# Patient Record
Sex: Female | Born: 1990 | Race: White | Hispanic: No | Marital: Single | Smoking: Never smoker
Health system: Southern US, Community
[De-identification: ages and names within clinical notes are randomized; demographics above are authoritative.]

## PROBLEM LIST (undated history)

## (undated) DIAGNOSIS — F32A Depression, unspecified: Secondary | ICD-10-CM

## (undated) DIAGNOSIS — F419 Anxiety disorder, unspecified: Secondary | ICD-10-CM

## (undated) HISTORY — PX: TONSILLECTOMY: SUR1361

## (undated) HISTORY — PX: SCAR REVISION: SHX5285

## (undated) HISTORY — PX: CHOLECYSTECTOMY: SHX55

---

## 2019-04-25 ENCOUNTER — Other Ambulatory Visit: Payer: Self-pay

## 2019-04-25 DIAGNOSIS — Z20822 Contact with and (suspected) exposure to covid-19: Secondary | ICD-10-CM

## 2019-04-28 LAB — NOVEL CORONAVIRUS, NAA: SARS-CoV-2, NAA: NOT DETECTED

## 2020-12-31 LAB — OB RESULTS CONSOLE ABO/RH: RH Type: POSITIVE

## 2020-12-31 LAB — OB RESULTS CONSOLE GC/CHLAMYDIA
Chlamydia: NEGATIVE
Gonorrhea: NEGATIVE

## 2020-12-31 LAB — OB RESULTS CONSOLE ANTIBODY SCREEN: Antibody Screen: NEGATIVE

## 2020-12-31 LAB — OB RESULTS CONSOLE HEPATITIS B SURFACE ANTIGEN: Hepatitis B Surface Ag: NEGATIVE

## 2020-12-31 LAB — OB RESULTS CONSOLE RPR: RPR: NONREACTIVE

## 2020-12-31 LAB — OB RESULTS CONSOLE RUBELLA ANTIBODY, IGM: Rubella: IMMUNE

## 2020-12-31 LAB — OB RESULTS CONSOLE HIV ANTIBODY (ROUTINE TESTING): HIV: NONREACTIVE

## 2020-12-31 LAB — HEPATITIS C ANTIBODY: HCV Ab: NEGATIVE

## 2021-03-07 ENCOUNTER — Inpatient Hospital Stay (HOSPITAL_COMMUNITY)
Admission: AD | Admit: 2021-03-07 | Discharge: 2021-03-07 | Disposition: A | Discharge status: Home / Self Care | Payer: 59 | Attending: Obstetrics and Gynecology | Admitting: Obstetrics and Gynecology

## 2021-03-07 ENCOUNTER — Encounter (HOSPITAL_COMMUNITY): Payer: Self-pay | Admitting: Obstetrics and Gynecology

## 2021-03-07 ENCOUNTER — Other Ambulatory Visit: Payer: Self-pay

## 2021-03-07 DIAGNOSIS — R0981 Nasal congestion: Secondary | ICD-10-CM | POA: Diagnosis present

## 2021-03-07 DIAGNOSIS — Z3A18 18 weeks gestation of pregnancy: Secondary | ICD-10-CM | POA: Insufficient documentation

## 2021-03-07 DIAGNOSIS — U071 COVID-19: Secondary | ICD-10-CM | POA: Diagnosis not present

## 2021-03-07 DIAGNOSIS — O98512 Other viral diseases complicating pregnancy, second trimester: Secondary | ICD-10-CM | POA: Insufficient documentation

## 2021-03-07 NOTE — MAU Provider Note (Signed)
  History     CSN: [id number]  Arrival date and time: 03/07/21 1140   Event Date/Time   First Provider Initiated Contact with Patient 03/07/21 1201      Chief Complaint  Patient presents with   Nasal Congestion   HPI Tychelle A Juenger is a 30 y.o. G1P0 at 104w0d who presents from urgent care for evaluation. Has had a non productive cough & sinus congestion since Friday. Went to urgent care this morning & was diagnosed with COVID. Was told she needed to come her because her "blood pressure was low". She denies fever, body aches, shortness of breath, chest pain, abdominal pain, or vaginal bleeding.   OB History     Gravida  1   Para      Term      Preterm      AB      Living         SAB      IAB      Ectopic      Multiple      Live Births              No past medical history on file.  History reviewed. No pertinent surgical history.  No family history on file.     Allergies:  Allergies  Allergen Reactions   Hydrocodone Hives and Nausea Only    No medications prior to admission.    Review of Systems  Constitutional: Negative.   HENT:  Positive for congestion. Negative for sore throat.   Respiratory:  Positive for cough. Negative for chest tightness, shortness of breath and wheezing.   Cardiovascular:  Negative for chest pain and palpitations.  Gastrointestinal: Negative.   Genitourinary: Negative.   Musculoskeletal:  Negative for back pain.  Neurological:  Negative for dizziness and headaches.  Physical Exam   Blood pressure 112/63, pulse 67, temperature 98 F (36.7 C), resp. rate 18, SpO2 100 %.  Physical Exam Vitals and nursing note reviewed.  Constitutional:      Appearance: Normal appearance. She is not ill-appearing or toxic-appearing.  HENT:     Head: Normocephalic and atraumatic.  Eyes:     General: No scleral icterus. Pulmonary:     Effort: Pulmonary effort is normal. No respiratory distress.  Neurological:     General: No  focal deficit present.     Mental Status: She is alert.  Psychiatric:        Mood and Affect: Mood normal.        Behavior: Behavior normal.    MAU Course  Procedures  MDM FHT present via doppler Pt is normotensive & SpO2 is 100% She denies CP, SOB, or Ob complaints. Discussed symptomatic treatment & reasons to return to the hospital.   Assessment and Plan   1. COVID-19 affecting pregnancy in second trimester   2. [redacted] weeks gestation of pregnancy    -OTC med list given -discussed quarantine & given work note -reviewed reasons to return to MAU  Judeth Horn 03/07/2021, 2:20 PM

## 2021-03-07 NOTE — Discharge Instructions (Signed)

## 2021-03-07 NOTE — MAU Note (Signed)
Pt went to urgent care thought she had a sinus infection. Had some bloody mucus she was coughing up.  Sent from urgent care due to positive covid test and her b/p was low 90's/50's. Pt asymptomatic Denies any sob or dizzyness.

## 2021-06-05 NOTE — L&D Delivery Note (Signed)
Vaginal Delivery Note ? ?Patient pushed for less than 30 minutes after she was noted to be C/C/+2.  ?Guided pushing with maternal urge and regular contractions. ?At 4:14 PM a viable and healthy female was delivered via Vaginal, Spontaneous (Presentation:  OA  ).  APGAR: 8, 9; weight  pending.   ?After head was delivered shoulders and body easily delivered.  Baby held upside down and meconium suctioned out of mouth and nose well. Cord was wrapped around shoulder and body. Baby laid on maternal abdomen, dried and tactile stimulation.  ?Baby noted to have a vigorous cry and moving all four extremities.  ?Delayed cord clamping done and cord cut by father. Cord blood obtained.  ? ?Placenta spontaneously delivered intact with trailing membranes,   ?Uterine atony alleviated by IV pitocin and massage.  ?There were no lacerations noted upon inspection of the cervix, vagina and perineum.  ? ?Patient tolerated delivery well, there were no complications.  ? ? ?Delivery Details: ?Delivery Type: NSVD  ?Anesthesia Epidural  ?Episiotomy:  N/A  ?Lacerations:  None  ?Repair suture:  N/A  ?Blood loss (ml):  300   ? ?Birth information: ?Date of birth:   [birth year]  ?Time of birth:    16:14  ?Sex: Information for the patient's newborn:  Inessa, Wardrop [[id number]]  ?female    ?Name: "Dennard Nip"  ?APGAR APGAR (1 MIN): 8   ?APGAR (5 MINS): 9   ?APGAR (10 MINS):    ?Weight  Pending  ? ?Resuscitation:   Suction, drying, tactile stimulation  ?Cord information:    Blood gases sent?  N ?Complications:     None  ?Placenta: Delivered:   Spontaneous, intact    appearance: Normal 3VC ? ? ?Disposition: ?Mom to postpartum.  Baby to Couplet care / Skin to Skin. ? ?Essie Hart MD ?08/06/2021, 4:44 PM ? ?   ? ?  ?

## 2021-06-23 ENCOUNTER — Encounter (HOSPITAL_COMMUNITY): Payer: Self-pay | Admitting: Obstetrics & Gynecology

## 2021-06-23 ENCOUNTER — Inpatient Hospital Stay (HOSPITAL_COMMUNITY)
Admission: AD | Admit: 2021-06-23 | Discharge: 2021-06-23 | Disposition: A | Discharge status: Home / Self Care | Payer: 59 | Attending: Obstetrics & Gynecology | Admitting: Obstetrics & Gynecology

## 2021-06-23 ENCOUNTER — Other Ambulatory Visit: Payer: Self-pay

## 2021-06-23 DIAGNOSIS — O212 Late vomiting of pregnancy: Secondary | ICD-10-CM | POA: Diagnosis present

## 2021-06-23 DIAGNOSIS — Z20822 Contact with and (suspected) exposure to covid-19: Secondary | ICD-10-CM | POA: Insufficient documentation

## 2021-06-23 DIAGNOSIS — B349 Viral infection, unspecified: Secondary | ICD-10-CM

## 2021-06-23 DIAGNOSIS — Z3689 Encounter for other specified antenatal screening: Secondary | ICD-10-CM | POA: Diagnosis not present

## 2021-06-23 DIAGNOSIS — O98513 Other viral diseases complicating pregnancy, third trimester: Secondary | ICD-10-CM | POA: Insufficient documentation

## 2021-06-23 DIAGNOSIS — O219 Vomiting of pregnancy, unspecified: Secondary | ICD-10-CM

## 2021-06-23 DIAGNOSIS — O99283 Endocrine, nutritional and metabolic diseases complicating pregnancy, third trimester: Secondary | ICD-10-CM | POA: Diagnosis not present

## 2021-06-23 DIAGNOSIS — E860 Dehydration: Secondary | ICD-10-CM | POA: Insufficient documentation

## 2021-06-23 DIAGNOSIS — Z3A33 33 weeks gestation of pregnancy: Secondary | ICD-10-CM | POA: Diagnosis not present

## 2021-06-23 HISTORY — DX: Depression, unspecified: F32.A

## 2021-06-23 HISTORY — DX: Anxiety disorder, unspecified: F41.9

## 2021-06-23 LAB — RESP PANEL BY RT-PCR (FLU A&B, COVID) ARPGX2
Influenza A by PCR: NEGATIVE
Influenza B by PCR: NEGATIVE
SARS Coronavirus 2 by RT PCR: NEGATIVE

## 2021-06-23 LAB — COMPREHENSIVE METABOLIC PANEL
ALT: 9 U/L (ref 0–44)
AST: 18 U/L (ref 15–41)
Albumin: 2.9 g/dL — ABNORMAL LOW (ref 3.5–5.0)
Alkaline Phosphatase: 142 U/L — ABNORMAL HIGH (ref 38–126)
Anion gap: 12 (ref 5–15)
BUN: 5 mg/dL — ABNORMAL LOW (ref 6–20)
CO2: 19 mmol/L — ABNORMAL LOW (ref 22–32)
Calcium: 8.9 mg/dL (ref 8.9–10.3)
Chloride: 106 mmol/L (ref 98–111)
Creatinine, Ser: 0.62 mg/dL (ref 0.44–1.00)
GFR, Estimated: >60 mL/min (ref >60)
Glucose, Bld: 87 mg/dL (ref 70–99)
Potassium: 3.7 mmol/L (ref 3.5–5.1)
Sodium: 137 mmol/L (ref 135–145)
Total Bilirubin: 1.1 mg/dL (ref 0.3–1.2)
Total Protein: 6 g/dL — ABNORMAL LOW (ref 6.5–8.1)

## 2021-06-23 LAB — URINALYSIS, MICROSCOPIC (REFLEX)

## 2021-06-23 LAB — URINALYSIS, ROUTINE W REFLEX MICROSCOPIC
Glucose, UA: 100 mg/dL — AB
Hgb urine dipstick: NEGATIVE
Ketones, ur: >80 mg/dL — AB
Nitrite: NEGATIVE
Protein, ur: 30 mg/dL — AB
Specific Gravity, Urine: >1.03 — ABNORMAL HIGH (ref 1.005–1.030)
pH: 6 (ref 5.0–8.0)

## 2021-06-23 MED ORDER — SCOPOLAMINE 1 MG/3DAYS TD PT72: 1.0000 | MEDICATED_PATCH | Freq: Once | TRANSDERMAL | 12 refills | Status: AC

## 2021-06-23 MED ORDER — ONDANSETRON 4 MG PO TBDP
8.0000 mg | ORAL_TABLET | Freq: Once | ORAL | Status: DC
Start: 2021-06-23 — End: 2021-06-23

## 2021-06-23 MED ORDER — SCOPOLAMINE 1 MG/3DAYS TD PT72
1.0000 | MEDICATED_PATCH | Freq: Once | TRANSDERMAL | Status: DC
  Administered 2021-06-23: 1.5 mg via TRANSDERMAL
  Filled 2021-06-23: qty 1

## 2021-06-23 MED ORDER — ONDANSETRON 4 MG PO TBDP: 4.0000 mg | ORAL_TABLET | Freq: Three times a day (TID) | ORAL | 2 refills | Status: DC | PRN

## 2021-06-23 MED ORDER — SODIUM CHLORIDE 0.9 % IV SOLN
25.0000 mg | Freq: Once | INTRAVENOUS | Status: AC
  Administered 2021-06-23: 25 mg via INTRAVENOUS
  Filled 2021-06-23: qty 1

## 2021-06-23 MED ORDER — PROMETHAZINE HCL 25 MG PO TABS: 25.0000 mg | ORAL_TABLET | Freq: Four times a day (QID) | ORAL | 2 refills | Status: DC | PRN

## 2021-06-23 MED ORDER — SODIUM CHLORIDE 0.9 % IV SOLN
8.0000 mg | Freq: Once | INTRAVENOUS | Status: AC
  Administered 2021-06-23: 8 mg via INTRAVENOUS
  Filled 2021-06-23: qty 4

## 2021-06-23 NOTE — MAU Note (Signed)
Emma Cruz is a 31 y.o. at [redacted]w[redacted]d here in MAU reporting: nausea and vomiting for the past week. States it has been getting worse. 3 episodes of emesis in the past 24 hours. Was Rx zofran yesterday, took a dose around 0300 today and states it has not helped. No vomiting since 0200. No bleeding or LOF. Having some mild lower abdominal cramping.  Onset of complaint: ongoing  Pain score: 3/10  Vitals:   06/23/21 0957  BP: 117/73  Pulse: 79  Resp: 18  Temp: 98 F (36.7 C)  SpO2: 97%     FHT: EFM applied in room  Lab orders placed from triage: UA

## 2021-06-23 NOTE — MAU Provider Note (Signed)
History     [id number]  Arrival date and time: 06/23/21 0930    Chief Complaint  Patient presents with   Abdominal Pain   Nausea     HPI Emma Cruz is a 31 y.o. at 7036w3d, who presents for dehydration.   Review of outside prenatal records [facility] (in media tab): unfortunately no records available for review at [redacted] weeks gestation   Patient reports she has had nausea and vomiting for the past week and has been unable to keep down solids or liquids She went to her OB office yesterday and had blood/urine work done, she was instructed to try and PO hydrate overnight and present to MAU if not feeling better in the morning She was given zofran but feels that it did not help her very much Denies any other associated symptoms such as runny nose/congestion, cough, chest pain, shortness of breath, fever, burning or pain with urination, vaginal discharge No sick contacts that she knows of Has not been tested for covid or flu No vaginal bleeding or leaking fluid Some cramping but no contractions Fetal movement is normal      OB History     Gravida  2   Para  1   Term  1   Preterm      AB      Living  1      SAB      IAB      Ectopic      Multiple      Live Births  1           Past Medical History:  Diagnosis Date   Anxiety    Depression     Past Surgical History:  Procedure Laterality Date   CHOLECYSTECTOMY     SCAR REVISION     on left jaw   TONSILLECTOMY      Family History  Problem Relation Age of Onset   Diabetes Mother    Cancer Father        thyroid    Social History   Socioeconomic History   Marital status: Single    Spouse name: Not on file   Number of children: Not on file   Years of education: Not on file   Highest education level: Not on file  Occupational History   Not on file  Tobacco Use   Smoking status: Never   Smokeless tobacco: Never  Substance and Sexual Activity   Alcohol use: Not Currently   Drug  use: Never   Sexual activity: Not on file  Other Topics Concern   Not on file  Social History Narrative   Not on file   Social Determinants of Health   Financial Resource Strain: Not on file  Food Insecurity: Not on file  Transportation Needs: Not on file  Physical Activity: Not on file  Stress: Not on file  Social Connections: Not on file  Intimate Partner Violence: Not on file    Allergies  Allergen Reactions   Hydrocodone Hives and Nausea Only   Latex Rash    No current facility-administered medications on file prior to encounter.   Current Outpatient Medications on File Prior to Encounter  Medication Sig Dispense Refill   clonazePAM (KLONOPIN) 0.5 MG tablet Take 0.5 mg by mouth 3 (three) times daily as needed.     FLUoxetine (PROZAC) 20 MG capsule Take 20 mg by mouth daily.       ROS Pertinent positives and negative per HPI,  all others reviewed and negative  Physical Exam   BP 117/73 (BP Location: Right Arm)    Pulse 79    Temp 98 F (36.7 C) (Oral)    Resp 18    Ht 5\' 5"  (1.651 m)    Wt 78.1 kg    SpO2 97%    BMI 28.66 kg/m   Patient Vitals for the past 24 hrs:  BP Temp Temp src Pulse Resp SpO2 Height Weight  06/23/21 0957 117/73 98 F (36.7 C) Oral 79 18 97 % 5\' 5"  (1.651 m) 78.1 kg    Physical Exam Vitals reviewed.  Constitutional:      General: She is not in acute distress.    Appearance: She is well-developed. She is not diaphoretic.  Eyes:     General: No scleral icterus. Pulmonary:     Effort: Pulmonary effort is normal. No respiratory distress.  Skin:    General: Skin is warm and dry.  Neurological:     Mental Status: She is alert.     Coordination: Coordination normal.     Cervical Exam    Bedside Ultrasound Not done  My interpretation: n/a  FHT Baseline 135, moderate variability, +accels, no decels Toco: irritability Cat: I Reviewed at: 10:21 AM  Reviewed again at 12:25 PM, rare contractions but otherwise unchanged, still Cat  I   Labs Results for orders placed or performed during the hospital encounter of 06/23/21 (from the past 24 hour(s))  Urinalysis, Routine w reflex microscopic Urine, Clean Catch     Status: Abnormal   Collection Time: 06/23/21  9:46 AM  Result Value Ref Range   Color, Urine YELLOW YELLOW   APPearance HAZY (A) CLEAR   Specific Gravity, Urine >1.030 (H) 1.005 - 1.030   pH 6.0 5.0 - 8.0   Glucose, UA 100 (A) NEGATIVE mg/dL   Hgb urine dipstick NEGATIVE NEGATIVE   Bilirubin Urine SMALL (A) NEGATIVE   Ketones, ur >80 (A) NEGATIVE mg/dL   Protein, ur 30 (A) NEGATIVE mg/dL   Nitrite NEGATIVE NEGATIVE   Leukocytes,Ua TRACE (A) NEGATIVE  Urinalysis, Microscopic (reflex)     Status: Abnormal   Collection Time: 06/23/21  9:46 AM  Result Value Ref Range   RBC / HPF 0-5 0 - 5 RBC/hpf   WBC, UA 21-50 0 - 5 WBC/hpf   Bacteria, UA RARE (A) NONE SEEN   Squamous Epithelial / LPF 6-10 0 - 5   Mucus PRESENT    Hyaline Casts, UA PRESENT   Comprehensive metabolic panel     Status: Abnormal   Collection Time: 06/23/21 10:33 AM  Result Value Ref Range   Sodium 137 135 - 145 mmol/L   Potassium 3.7 3.5 - 5.1 mmol/L   Chloride 106 98 - 111 mmol/L   CO2 19 (L) 22 - 32 mmol/L   Glucose, Bld 87 70 - 99 mg/dL   BUN 5 (L) 6 - 20 mg/dL   Creatinine, Ser 06/25/21 0.44 - 1.00 mg/dL   Calcium 8.9 8.9 - 06/25/21 mg/dL   Total Protein 6.0 (L) 6.5 - 8.1 g/dL   Albumin 2.9 (L) 3.5 - 5.0 g/dL   AST 18 15 - 41 U/L   ALT 9 0 - 44 U/L   Alkaline Phosphatase 142 (H) 38 - 126 U/L   Total Bilirubin 1.1 0.3 - 1.2 mg/dL   GFR, Estimated 0.86 57.8 mL/min   Anion gap 12 5 - 15  Resp Panel by RT-PCR (Flu A&B, Covid) Nasopharyngeal Swab  Status: None   Collection Time: 06/23/21 10:33 AM   Specimen: Nasopharyngeal Swab; Nasopharyngeal(NP) swabs in vial transport medium  Result Value Ref Range   SARS Coronavirus 2 by RT PCR NEGATIVE NEGATIVE   Influenza A by PCR NEGATIVE NEGATIVE   Influenza B by PCR NEGATIVE NEGATIVE     Imaging No results found.  MAU Course  Procedures Lab Orders         Resp Panel by RT-PCR (Flu A&B, Covid) Nasopharyngeal Swab         Urinalysis, Routine w reflex microscopic Urine, Clean Catch         Comprehensive metabolic panel         Urinalysis, Microscopic (reflex)    Meds ordered this encounter  Medications   DISCONTD: ondansetron (ZOFRAN-ODT) disintegrating tablet 8 mg   ondansetron (ZOFRAN) 8 mg in sodium chloride 0.9 % 50 mL IVPB   promethazine (PHENERGAN) 25 mg in sodium chloride 0.9 % 1,000 mL infusion   scopolamine (TRANSDERM-SCOP) 1 MG/3DAYS 1.5 mg   scopolamine (TRANSDERM-SCOP) 1 MG/3DAYS    Sig: Place 1 patch (1.5 mg total) onto the skin once for 1 dose.    Dispense:  10 patch    Refill:  12   ondansetron (ZOFRAN-ODT) 4 MG disintegrating tablet    Sig: Take 1 tablet (4 mg total) by mouth every 8 (eight) hours as needed for nausea or vomiting.    Dispense:  30 tablet    Refill:  2   promethazine (PHENERGAN) 25 MG tablet    Sig: Take 1 tablet (25 mg total) by mouth every 6 (six) hours as needed for nausea or vomiting.    Dispense:  30 tablet    Refill:  2   Imaging Orders  No imaging studies ordered today    MDM moderate  Assessment and Plan  #Nausea and vomiting in pregnancy, third trimester #[redacted] weeks gestation of pregnancy #Viral illness Likely viral GI illness, COVID and flu swab negative. CMP unremarkable. Improved symptoms after fluids, zofran, phenergan, scopolamine patch. Low suspicion for PTL. Will d/c with antiemetics, cont to PO hydrate, return to MAU if unable to do so.   #FWB FHT Cat I NST: Reactive   Dispo: discharged to home in stable condition.   Venora Maples, MD/MPH 06/23/21 12:29 PM  Allergies as of 06/23/2021       Reactions   Hydrocodone Hives, Nausea Only   Latex Rash        Medication List     TAKE these medications    clonazePAM 0.5 MG tablet Commonly known as: KLONOPIN Take 0.5 mg by mouth 3 (three)  times daily as needed.   FLUoxetine 20 MG capsule Commonly known as: PROZAC Take 20 mg by mouth daily.   ondansetron 4 MG disintegrating tablet Commonly known as: ZOFRAN-ODT Take 1 tablet (4 mg total) by mouth every 8 (eight) hours as needed for nausea or vomiting.   promethazine 25 MG tablet Commonly known as: PHENERGAN Take 1 tablet (25 mg total) by mouth every 6 (six) hours as needed for nausea or vomiting.   scopolamine 1 MG/3DAYS Commonly known as: TRANSDERM-SCOP Place 1 patch (1.5 mg total) onto the skin once for 1 dose.

## 2021-07-13 LAB — OB RESULTS CONSOLE GBS: GBS: NEGATIVE

## 2021-07-22 ENCOUNTER — Inpatient Hospital Stay (HOSPITAL_COMMUNITY)
Admission: AD | Admit: 2021-07-22 | Discharge: 2021-07-22 | Disposition: A | Discharge status: Home / Self Care | Payer: 59 | Attending: Obstetrics and Gynecology | Admitting: Obstetrics and Gynecology

## 2021-07-22 ENCOUNTER — Other Ambulatory Visit: Payer: Self-pay

## 2021-07-22 ENCOUNTER — Encounter (HOSPITAL_COMMUNITY): Payer: Self-pay | Admitting: Obstetrics and Gynecology

## 2021-07-22 DIAGNOSIS — Z3A37 37 weeks gestation of pregnancy: Secondary | ICD-10-CM

## 2021-07-22 DIAGNOSIS — O471 False labor at or after 37 completed weeks of gestation: Secondary | ICD-10-CM | POA: Insufficient documentation

## 2021-07-22 DIAGNOSIS — O479 False labor, unspecified: Secondary | ICD-10-CM

## 2021-07-22 NOTE — MAU Provider Note (Signed)
None      S: Ms. Isabell Bonafede Lem is a 31 y.o. G2P1001 at [redacted]w[redacted]d  who presents to MAU today complaining contractions q 3 minutes  She denies vaginal bleeding. She denies LOF. She reports normal fetal movement.    O: BP 119/78    Pulse 75    Temp 97.6 F (36.4 C)    Resp 18    Ht 5\' 5"  (1.651 m)    Wt 80.7 kg    SpO2 100%    BMI 29.62 kg/m  GENERAL: Well-developed, well-nourished female in no acute distress.  HEAD: Normocephalic, atraumatic.  CHEST: Normal effort of breathing, regular heart rate ABDOMEN: Soft, nontender, gravid  Cervical exam:  Dilation: 1.5 Effacement (%): 80 Station: -1 Presentation: Vertex Exam by:: BRIA, RN   Fetal Monitoring: Baseline: 135 Variability: average Accelerations: present Decelerations: absent Contractions: irregular   A: SIUP at [redacted]w[redacted]d  Not in labor  P: Discharge home Labor precautions Encouraged to return if she develops worsening of symptoms, increase in pain, fever, or other concerning symptoms.     [redacted]w[redacted]d, CNM 07/22/2021 3:27 AM

## 2021-07-22 NOTE — MAU Note (Signed)
Ctxs all day yesterday but more consistent since 1700. Denies LOF or VB. 1cm on Tues. Baby has been active but not as active in the last 24hrs as usual

## 2021-07-24 ENCOUNTER — Other Ambulatory Visit: Payer: Self-pay

## 2021-07-24 ENCOUNTER — Encounter (HOSPITAL_COMMUNITY): Payer: Self-pay | Admitting: Obstetrics and Gynecology

## 2021-07-24 ENCOUNTER — Inpatient Hospital Stay (HOSPITAL_COMMUNITY)
Admission: AD | Admit: 2021-07-24 | Discharge: 2021-07-24 | Disposition: A | Discharge status: Home / Self Care | Payer: 59 | Attending: Obstetrics and Gynecology | Admitting: Obstetrics and Gynecology

## 2021-07-24 DIAGNOSIS — Z3A37 37 weeks gestation of pregnancy: Secondary | ICD-10-CM

## 2021-07-24 DIAGNOSIS — O479 False labor, unspecified: Secondary | ICD-10-CM | POA: Diagnosis not present

## 2021-07-24 DIAGNOSIS — Z0371 Encounter for suspected problem with amniotic cavity and membrane ruled out: Secondary | ICD-10-CM

## 2021-07-24 DIAGNOSIS — O471 False labor at or after 37 completed weeks of gestation: Secondary | ICD-10-CM | POA: Diagnosis not present

## 2021-07-24 DIAGNOSIS — O26893 Other specified pregnancy related conditions, third trimester: Secondary | ICD-10-CM | POA: Diagnosis present

## 2021-07-24 LAB — POCT FERN TEST: POCT Fern Test: NEGATIVE

## 2021-07-24 NOTE — MAU Provider Note (Signed)
History     [id number]  Arrival date and time: 07/24/21 2056    Chief Complaint  Patient presents with   Labor Eval   Rupture of Membranes     HPI Emma Cruz is a 31 y.o. at [redacted]w[redacted]d  who presents for contractions & vaginal discharge.  Reports episode of watery discharge earlier today that was clear with no odor.  No gush of fluid.  Leaking has not continued.  No vaginal bleeding.  Also reports contractions throughout the day that intensified at 7 PM cannot tell how frequent.  Rates pain 6/10.  Cervix was 1.5 cm dilated last week.  Vaginal bleeding: No LOF: Yes Fetal Movement: Yes Contractions: Yes     OB History     Gravida  2   Para  1   Term  1   Preterm      AB      Living  1      SAB      IAB      Ectopic      Multiple      Live Births  1           Past Medical History:  Diagnosis Date   Anxiety    Depression     Past Surgical History:  Procedure Laterality Date   CHOLECYSTECTOMY     SCAR REVISION     on left jaw   TONSILLECTOMY      Family History  Problem Relation Age of Onset   Diabetes Mother    Cancer Father        thyroid    Social History   Socioeconomic History   Marital status: Single    Spouse name: Not on file   Number of children: Not on file   Years of education: Not on file   Highest education level: Not on file  Occupational History   Not on file  Tobacco Use   Smoking status: Never   Smokeless tobacco: Never  Substance and Sexual Activity   Alcohol use: Not Currently   Drug use: Never   Sexual activity: Yes  Other Topics Concern   Not on file  Social History Narrative   Not on file   Social Determinants of Health   Financial Resource Strain: Not on file  Food Insecurity: Not on file  Transportation Needs: Not on file  Physical Activity: Not on file  Stress: Not on file  Social Connections: Not on file  Intimate Partner Violence: Not on file    Allergies  Allergen Reactions    Hydrocodone Hives and Nausea Only   Latex Rash    No current facility-administered medications on file prior to encounter.   Current Outpatient Medications on File Prior to Encounter  Medication Sig Dispense Refill   clonazePAM (KLONOPIN) 0.5 MG tablet Take 0.5 mg by mouth 3 (three) times daily as needed.     FLUoxetine (PROZAC) 20 MG capsule Take 20 mg by mouth daily.     ondansetron (ZOFRAN-ODT) 4 MG disintegrating tablet Take 1 tablet (4 mg total) by mouth every 8 (eight) hours as needed for nausea or vomiting. 30 tablet 2   promethazine (PHENERGAN) 25 MG tablet Take 1 tablet (25 mg total) by mouth every 6 (six) hours as needed for nausea or vomiting. 30 tablet 2     ROS Pertinent positives and negative per HPI, all others reviewed and negative  Physical Exam   BP 116/75    Pulse 61  Temp 97.7 F (36.5 C) (Oral)    Resp 18    Ht 5\' 5"  (1.651 m)    Wt 80 kg    SpO2 99%    BMI 29.34 kg/m   Patient Vitals for the past 24 hrs:  BP Temp Temp src Pulse Resp SpO2 Height Weight  07/24/21 2306 116/75 -- -- 61 -- -- -- --  07/24/21 2114 118/78 97.7 F (36.5 C) Oral 82 18 -- 5\' 5"  (1.651 m) 80 kg  07/24/21 2113 -- -- -- -- -- 99 % -- --    Physical Exam Vitals and nursing note reviewed. Exam conducted with a chaperone present.  Constitutional:      General: She is not in acute distress.    Appearance: Normal appearance.  Pulmonary:     Effort: Pulmonary effort is normal. No respiratory distress.  Genitourinary:    Comments: Pelvic: NEFG, small amount of thin white discharge. No pooling of fluid. No bleeding.  Skin:    General: Skin is warm and dry.  Neurological:     Mental Status: She is alert.  Psychiatric:        Mood and Affect: Mood normal.        Behavior: Behavior normal.     Cervical Exam Dilation: 1 Effacement (%): 50 Cervical Position: Middle Station: -2 Presentation: Vertex Exam by:: [id number] RN   FHT Baseline 120, moderate variability, 15x15  accels, no decels Toco: Q2-6 minutes Cat: 1  Labs Results for orders placed or performed during the hospital encounter of 07/24/21 (from the past 24 hour(s))  POCT fern test     Status: Normal   Collection Time: 07/24/21  9:49 PM  Result Value Ref Range   POCT Fern Test Negative = intact amniotic membranes     Imaging No results found.  MAU Course  Procedures Lab Orders         POCT fern test    No orders of the defined types were placed in this encounter.  Imaging Orders  No imaging studies ordered today    MDM Sterile speculum exam performed. No pooling of fluid. Fern slide collected & reviewed by me - negative.  Labor evaluation completed by RN. Cervix unchanged.   Review of outside records: [facility] prenatal records under media tab reviewed. GBS not listed.  Assessment and Plan   1. Encounter for suspected PROM, with rupture of membranes not found  -no pooling, fern negative  2. False labor  -cervix unchanged -discussed labor precautions & reasons to return  3. [redacted] weeks gestation of pregnancy      Discharge Instructions     Discharge patient   Complete by: As directed    Discharge disposition: 01-Home or Self Care   Discharge patient date: 07/24/2021       07/26/21, NP 07/24/21 11:16 PM

## 2021-07-24 NOTE — MAU Note (Signed)
..  Syniah A Senn is a 31 y.o. at [redacted]w[redacted]d here in MAU reporting: CTX all day, but more intense since 1900, and possible SROM around 2015. Pt reports clear odorless fluid, that has slow leak and continue. Pt is not wearing a pad.Pt reports having a HA, and has not taken any pain medication.  Pt denies VB, DFM, and complication in the pregnancy. No recent intercourse.  GBS neg SVE last Friday 1.5 cm  Onset of complaint: 1900 Pain score: 6/10 Vitals:   07/24/21 2113 07/24/21 2114  BP:  118/78  Pulse:  82  Resp:  18  Temp:  97.7 F (36.5 C)  SpO2: 99%      [id number] Lab orders placed from triage:  none

## 2021-08-03 ENCOUNTER — Other Ambulatory Visit: Payer: Self-pay | Admitting: Obstetrics & Gynecology

## 2021-08-03 ENCOUNTER — Telehealth (HOSPITAL_COMMUNITY): Payer: Self-pay | Admitting: *Deleted

## 2021-08-03 NOTE — Telephone Encounter (Signed)
Preadmission screen  

## 2021-08-06 ENCOUNTER — Inpatient Hospital Stay (HOSPITAL_COMMUNITY)
Admission: AD | Admit: 2021-08-06 | Discharge: 2021-08-08 | DRG: 807 | Disposition: A | Discharge status: Home / Self Care | Payer: 59 | Attending: Obstetrics & Gynecology | Admitting: Obstetrics & Gynecology

## 2021-08-06 ENCOUNTER — Inpatient Hospital Stay (HOSPITAL_COMMUNITY): Payer: 59 | Admitting: Anesthesiology

## 2021-08-06 ENCOUNTER — Encounter (HOSPITAL_COMMUNITY): Payer: Self-pay | Admitting: Obstetrics & Gynecology

## 2021-08-06 ENCOUNTER — Other Ambulatory Visit: Payer: Self-pay

## 2021-08-06 DIAGNOSIS — O26893 Other specified pregnancy related conditions, third trimester: Secondary | ICD-10-CM | POA: Diagnosis present

## 2021-08-06 DIAGNOSIS — O480 Post-term pregnancy: Secondary | ICD-10-CM | POA: Diagnosis present

## 2021-08-06 DIAGNOSIS — O99344 Other mental disorders complicating childbirth: Secondary | ICD-10-CM | POA: Diagnosis present

## 2021-08-06 DIAGNOSIS — Z3A39 39 weeks gestation of pregnancy: Secondary | ICD-10-CM | POA: Diagnosis not present

## 2021-08-06 DIAGNOSIS — Z20822 Contact with and (suspected) exposure to covid-19: Secondary | ICD-10-CM | POA: Diagnosis present

## 2021-08-06 DIAGNOSIS — F410 Panic disorder [episodic paroxysmal anxiety] without agoraphobia: Secondary | ICD-10-CM | POA: Diagnosis present

## 2021-08-06 LAB — CBC
HCT: 38.6 % (ref 36.0–46.0)
Hemoglobin: 12.8 g/dL (ref 12.0–15.0)
MCH: 28 pg (ref 26.0–34.0)
MCHC: 33.2 g/dL (ref 30.0–36.0)
MCV: 84.5 fL (ref 80.0–100.0)
Platelets: 187 10*3/uL (ref 150–400)
RBC: 4.57 MIL/uL (ref 3.87–5.11)
RDW: 13.4 % (ref 11.5–15.5)
WBC: 15 10*3/uL — ABNORMAL HIGH (ref 4.0–10.5)
nRBC: 0 % (ref 0.0–0.2)

## 2021-08-06 LAB — RESP PANEL BY RT-PCR (FLU A&B, COVID) ARPGX2
Influenza A by PCR: NEGATIVE
Influenza B by PCR: NEGATIVE
SARS Coronavirus 2 by RT PCR: NEGATIVE

## 2021-08-06 LAB — TYPE AND SCREEN
ABO/RH(D): O POS
Antibody Screen: NEGATIVE

## 2021-08-06 MED ORDER — LIDOCAINE HCL (PF) 1 % IJ SOLN: 30.0000 mL | INTRAMUSCULAR | Status: DC | PRN

## 2021-08-06 MED ORDER — TETANUS-DIPHTH-ACELL PERTUSSIS 5-2.5-18.5 LF-MCG/0.5 IM SUSY
0.5000 mL | PREFILLED_SYRINGE | Freq: Once | INTRAMUSCULAR | Status: DC
Start: 2021-08-07 — End: 2021-08-08

## 2021-08-06 MED ORDER — FENTANYL-BUPIVACAINE-NACL 0.5-0.125-0.9 MG/250ML-% EP SOLN
12.0000 mL/h | EPIDURAL | Status: DC | PRN
  Administered 2021-08-06: 12 mL/h via EPIDURAL

## 2021-08-06 MED ORDER — LACTATED RINGERS IV SOLN: 500.0000 mL | Freq: Once | INTRAVENOUS | Status: DC

## 2021-08-06 MED ORDER — EPHEDRINE 5 MG/ML INJ: 10.0000 mg | INTRAVENOUS | Status: DC | PRN

## 2021-08-06 MED ORDER — LACTATED RINGERS IV SOLN: INTRAVENOUS | Status: DC

## 2021-08-06 MED ORDER — OXYTOCIN BOLUS FROM INFUSION
333.0000 mL | Freq: Once | INTRAVENOUS | Status: AC
  Administered 2021-08-06: 333 mL via INTRAVENOUS

## 2021-08-06 MED ORDER — COCONUT OIL OIL: 1.0000 "application " | TOPICAL_OIL | Status: DC | PRN

## 2021-08-06 MED ORDER — DIBUCAINE (PERIANAL) 1 % EX OINT: 1.0000 "application " | TOPICAL_OINTMENT | CUTANEOUS | Status: DC | PRN

## 2021-08-06 MED ORDER — FLUOXETINE HCL 20 MG PO CAPS
20.0000 mg | ORAL_CAPSULE | Freq: Every day | ORAL | Status: DC
  Administered 2021-08-07 – 2021-08-08 (×2): 20 mg via ORAL
  Filled 2021-08-06 (×2): qty 1

## 2021-08-06 MED ORDER — ONDANSETRON HCL 4 MG/2ML IJ SOLN: 4.0000 mg | Freq: Four times a day (QID) | INTRAMUSCULAR | Status: DC | PRN

## 2021-08-06 MED ORDER — ONDANSETRON HCL 4 MG PO TABS: 4.0000 mg | ORAL_TABLET | ORAL | Status: DC | PRN

## 2021-08-06 MED ORDER — PRENATAL MULTIVITAMIN CH
1.0000 | ORAL_TABLET | Freq: Every day | ORAL | Status: DC
  Administered 2021-08-07 – 2021-08-08 (×2): 1 via ORAL
  Filled 2021-08-06 (×2): qty 1

## 2021-08-06 MED ORDER — SIMETHICONE 80 MG PO CHEW: 80.0000 mg | CHEWABLE_TABLET | ORAL | Status: DC | PRN

## 2021-08-06 MED ORDER — PHENYLEPHRINE 40 MCG/ML (10ML) SYRINGE FOR IV PUSH (FOR BLOOD PRESSURE SUPPORT): 80.0000 ug | PREFILLED_SYRINGE | INTRAVENOUS | Status: DC | PRN

## 2021-08-06 MED ORDER — SOD CITRATE-CITRIC ACID 500-334 MG/5ML PO SOLN: 30.0000 mL | ORAL | Status: DC | PRN

## 2021-08-06 MED ORDER — IBUPROFEN 600 MG PO TABS
600.0000 mg | ORAL_TABLET | Freq: Four times a day (QID) | ORAL | Status: DC
  Administered 2021-08-06 – 2021-08-08 (×7): 600 mg via ORAL
  Filled 2021-08-06 (×8): qty 1

## 2021-08-06 MED ORDER — CLONAZEPAM 1 MG PO TABS: 0.5000 mg | ORAL_TABLET | Freq: Three times a day (TID) | ORAL | Status: DC | PRN

## 2021-08-06 MED ORDER — SENNOSIDES-DOCUSATE SODIUM 8.6-50 MG PO TABS
2.0000 | ORAL_TABLET | Freq: Every day | ORAL | Status: DC
  Administered 2021-08-07 – 2021-08-08 (×2): 2 via ORAL
  Filled 2021-08-06 (×2): qty 2

## 2021-08-06 MED ORDER — OXYTOCIN-SODIUM CHLORIDE 30-0.9 UT/500ML-% IV SOLN
2.5000 [IU]/h | INTRAVENOUS | Status: DC
  Filled 2021-08-06: qty 500

## 2021-08-06 MED ORDER — DIPHENHYDRAMINE HCL 50 MG/ML IJ SOLN: 12.5000 mg | INTRAMUSCULAR | Status: DC | PRN

## 2021-08-06 MED ORDER — DIPHENHYDRAMINE HCL 25 MG PO CAPS: 25.0000 mg | ORAL_CAPSULE | Freq: Four times a day (QID) | ORAL | Status: DC | PRN

## 2021-08-06 MED ORDER — ZOLPIDEM TARTRATE 5 MG PO TABS: 5.0000 mg | ORAL_TABLET | Freq: Every evening | ORAL | Status: DC | PRN

## 2021-08-06 MED ORDER — ACETAMINOPHEN 325 MG PO TABS: 650.0000 mg | ORAL_TABLET | ORAL | Status: DC | PRN

## 2021-08-06 MED ORDER — ONDANSETRON HCL 4 MG/2ML IJ SOLN: 4.0000 mg | INTRAMUSCULAR | Status: DC | PRN

## 2021-08-06 MED ORDER — LACTATED RINGERS IV SOLN: 500.0000 mL | INTRAVENOUS | Status: DC | PRN

## 2021-08-06 MED ORDER — LIDOCAINE HCL (PF) 1 % IJ SOLN
INTRAMUSCULAR | Status: DC | PRN
  Administered 2021-08-06 (×2): 4 mL via EPIDURAL

## 2021-08-06 MED ORDER — WITCH HAZEL-GLYCERIN EX PADS: 1.0000 "application " | MEDICATED_PAD | CUTANEOUS | Status: DC | PRN

## 2021-08-06 MED ORDER — FLUOXETINE HCL 20 MG PO CAPS: 20.0000 mg | ORAL_CAPSULE | Freq: Every day | ORAL | Status: DC

## 2021-08-06 MED ORDER — BENZOCAINE-MENTHOL 20-0.5 % EX AERO
1.0000 "application " | INHALATION_SPRAY | CUTANEOUS | Status: DC | PRN
  Administered 2021-08-06: 1 via TOPICAL
  Filled 2021-08-06: qty 56

## 2021-08-06 MED ORDER — FENTANYL-BUPIVACAINE-NACL 0.5-0.125-0.9 MG/250ML-% EP SOLN
EPIDURAL | Status: AC
  Filled 2021-08-06: qty 250

## 2021-08-06 NOTE — Anesthesia Procedure Notes (Signed)
Epidural ?Patient location during procedure: OB ?Start time: 08/06/2021 3:14 PM ?End time: 08/06/2021 3:17 PM ? ?Staffing ?Anesthesiologist: Kaylyn Layer, MD ?Performed: anesthesiologist  ? ?Preanesthetic Checklist ?Completed: patient identified, IV checked, risks and benefits discussed, monitors and equipment checked, pre-op evaluation and timeout performed ? ?Epidural ?Patient position: sitting ?Prep: DuraPrep and site prepped and draped ?Patient monitoring: continuous pulse ox, blood pressure and heart rate ?Approach: midline ?Location: L3-L4 ?Injection technique: LOR air ? ?Needle:  ?Needle type: Tuohy  ?Needle gauge: 17 G ?Needle length: 9 cm ?Needle insertion depth: 5 cm ?Catheter type: closed end flexible ?Catheter size: 19 Gauge ?Catheter at skin depth: 10 cm ?Test dose: negative and Other (1% lidocaine) ? ?Assessment ?Events: blood not aspirated, injection not painful, no injection resistance, no paresthesia and negative IV test ? ?Additional Notes ?Patient identified. Risks, benefits, and alternatives discussed with patient including but not limited to bleeding, infection, nerve damage, paralysis, failed block, incomplete pain control, headache, blood pressure changes, nausea, vomiting, reactions to medication, itching, and postpartum back pain. Confirmed with bedside nurse the patient's most recent platelet count. Confirmed with patient that they are not currently taking any anticoagulation, have any bleeding history, or any family history of bleeding disorders. Patient expressed understanding and wished to proceed. All questions were answered. Sterile technique was used throughout the entire procedure. Please see nursing notes for vital signs.  ? ?Crisp LOR on first pass. Test dose was given through epidural catheter and negative prior to continuing to dose epidural or start infusion. Warning signs of high block given to the patient including shortness of breath, tingling/numbness in hands, complete  motor block, or any concerning symptoms with instructions to call for help. Patient was given instructions on fall risk and not to get out of bed. All questions and concerns addressed with instructions to call with any issues or inadequate analgesia.  Reason for block:procedure for pain ? ? ? ?

## 2021-08-06 NOTE — Anesthesia Preprocedure Evaluation (Signed)
Anesthesia Evaluation  Patient identified by MRN, date of birth, ID band Patient awake    Reviewed: Allergy & Precautions, Patient's Chart, lab work & pertinent test results  History of Anesthesia Complications Negative for: history of anesthetic complications  Airway Mallampati: II  TM Distance: >3 FB Neck ROM: Full    Dental no notable dental hx.    Pulmonary neg pulmonary ROS,    Pulmonary exam normal        Cardiovascular negative cardio ROS Normal cardiovascular exam     Neuro/Psych Anxiety Depression negative neurological ROS     GI/Hepatic negative GI ROS, Neg liver ROS,   Endo/Other  negative endocrine ROS  Renal/GU negative Renal ROS  negative genitourinary   Musculoskeletal negative musculoskeletal ROS (+)   Abdominal   Peds  Hematology negative hematology ROS (+)   Anesthesia Other Findings Day of surgery medications reviewed with patient.  Reproductive/Obstetrics (+) Pregnancy                             Anesthesia Physical Anesthesia Plan  ASA: 2  Anesthesia Plan: Epidural   Post-op Pain Management:    Induction:   PONV Risk Score and Plan: Treatment may vary due to age or medical condition  Airway Management Planned: Natural Airway  Additional Equipment: Fetal Monitoring  Intra-op Plan:   Post-operative Plan:   Informed Consent: I have reviewed the patients History and Physical, chart, labs and discussed the procedure including the risks, benefits and alternatives for the proposed anesthesia with the patient or authorized representative who has indicated his/her understanding and acceptance.       Plan Discussed with:   Anesthesia Plan Comments:         Anesthesia Quick Evaluation  

## 2021-08-06 NOTE — MAU Note (Signed)
...  Emma Cruz is a 31 y.o. at [redacted]w[redacted]d here in MAU reporting: CTX since 0900 this morning that are currently "back to back." She states they were initially 10 minutes apart. Endorses bloody show. No LOF. +FM.  ? ?Membranes stripped in the office this past Tuesday and was 2 cm. ? ?Pain score:  ?7/10 lower abdomen ? ?Vitals:  ? 08/06/21 1243  ?BP: 120/70  ?Pulse: 68  ?Resp: 17  ?Temp: 97.8 ?F (36.6 ?C)  ?SpO2: 100%  ?   ?FHT: 145 initial external ?Lab orders placed from triage:  MAU Labor ? ?

## 2021-08-06 NOTE — Lactation Note (Signed)
This note was copied from a baby's chart. ?Lactation Consultation Note ? ?Patient Name: Emma Cruz ?Today's Date: 08/06/2021 ?Reason for consult: Initial assessment;Term ?Age:31 years ? ?LC in to room for initial consult. Mother states infant is latched upon arrival. Mother denies pain or discomfort. Noted good deep latch, back/neck support, optimal alignment and good suck/swallowing. ?Reviewed normal newborn behavior during first 24h, expected output and feeding frequency.  ? ?Plan: ?1-Skin to skin, aim for a deep, comfortable latch and breastfeed on demand or 8-12 times in 24h period. ?2-Encouraged maternal rest, hydration and food intake.  ?3-Contact LC as needed for feeds/support/concerns/questions ?  ?All questions answered at this time. Provided Lactation services brochure and promoted [employer] booklet information.  ? ? ?Maternal Data ?Has patient been taught Hand Expression?: No ?Does the patient have breastfeeding experience prior to this delivery?: Yes ?How long did the patient breastfeed?: 8 months ? ?Feeding ?Mother's Current Feeding Choice: Breast Milk ? ?LATCH Score ?Latch: Grasps breast easily, tongue down, lips flanged, rhythmical sucking. ? ?Audible Swallowing: Spontaneous and intermittent ? ?Type of Nipple: Everted at rest and after stimulation ? ?Comfort (Breast/Nipple): Soft / non-tender ? ?Hold (Positioning): No assistance needed to correctly position infant at breast. ? ?LATCH Score: 10 ? ?Interventions ?Interventions: Breast feeding basics reviewed;Skin to skin;Expressed milk;Education;LC Services brochure ? ?Discharge ?Pump: Personal ? ?Consult Status ?Consult Status: Follow-up ?Date: 08/07/21 ?Follow-up type: In-patient ? ? ? ?Joslyne Marshburn A Higuera Ancidey ?08/06/2021, 6:57 PM ? ? ? ?

## 2021-08-06 NOTE — H&P (Signed)
OB ADMISSION HISTORY & PHYSICAL ? ?Admission Date: 08/06/2021 12:35 PM  ?Admit Diagnosis: Active labor ? ?Emma Cruz is a 31 y.o. female G69P1011 [redacted]w[redacted]d presenting for regular painful contractions. Endorses active FM, denies LOF and vaginal bleeding. Ctx began @ 0900 ? ?History of current pregnancy: ?G3P1011   ?Prenatal Care with: [facility] ?Patient entered prenatal care at 7 wks.   ?EDC 08/08/21 by LMP and congruent w/ 7 wk U/S.   ?Anatomy scan:  20 wks, complete w/ posterior placenta.   ? ?Significant prenatal problems: ?Family history of congenital cardiac anomalies ?Fetal Tricuspid regurgitation ?Vitamin D deficiency ?Panic disorder, anxiety ?Depression  ? ?Patient Active Problem List  ? Diagnosis Date Noted  ? Post-dates pregnancy 08/06/2021  ? ? ?Prenatal Labs: ?ABO, Rh: O/Positive/-- (07/29 0000) ?Antibody: Negative (07/29 0000) ?Rubella: Immune (07/29 0000)  ?RPR: Nonreactive (07/29 0000)  ?HBsAg: Negative (07/29 0000)  ?HIV: Non-reactive (07/29 0000)  ?GBS: Negative/-- (02/08 0000)  ?GC/CHL: Neg ?Genetics: Low risk female NIPS ?Vaccines: ?Tdap: declined ?Flu declined ?Covid: declined ? ?Prenatal Transfer Tool  ?Maternal Diabetes: No ?Genetic Screening: Normal ?Maternal Ultrasounds/Referrals: Other:Mild mitral valve regurg ?Fetal Ultrasounds or other Referrals:  Fetal echo ?Maternal Substance Abuse:  No ?Significant Maternal Medications:  Meds include: Prozac Other: clonazepam, cyclobenzparine ?Significant Maternal Lab Results:  Group B Strep negative ?Other Comments:  None ? ?OB History  ?Gravida Para Term Preterm AB Living  ?3 1 1   1 1   ?SAB IAB Ectopic Multiple Live Births  ?1       1  ?  ?# Outcome Date GA Lbr Len/2nd Weight Sex Delivery Anes PTL Lv  ?3 Current           ?2 Term 02/14/14 [redacted]w[redacted]d    Vag-Spont   LIV  ?1 SAB           ? ? ?Medical / Surgical History: ?Past medical history:  ?Past Medical History:  ?Diagnosis Date  ? Anxiety   ? Depression   ?  ?Past surgical history:  ?Past Surgical History:   ?Procedure Laterality Date  ? CHOLECYSTECTOMY    ? SCAR REVISION    ? on left jaw  ? TONSILLECTOMY    ? ?Family History:  ?Family History  ?Problem Relation Age of Onset  ? Diabetes Mother   ? Cancer Father   ?     thyroid  ?  ?Social History:  reports that she has never smoked. She has never used smokeless tobacco. She reports that she does not currently use alcohol. She reports that she does not use drugs. ? ?Allergies: ?Hydrocodone and Latex ?  ?Current Medications at time of admission:  ?Prior to Admission medications   ?Medication Sig Start Date End Date Taking? Authorizing Provider  ?clonazePAM (KLONOPIN) 0.5 MG tablet Take 0.5 mg by mouth 3 (three) times daily as needed. 01/19/21  Yes [provider]  ?FLUoxetine (PROZAC) 20 MG capsule Take 20 mg by mouth daily. 09/17/20  Yes [provider]  ?ondansetron (ZOFRAN-ODT) 4 MG disintegrating tablet Take 1 tablet (4 mg total) by mouth every 8 (eight) hours as needed for nausea or vomiting. 06/23/21   Clarnce Flock, MD  ?promethazine (PHENERGAN) 25 MG tablet Take 1 tablet (25 mg total) by mouth every 6 (six) hours as needed for nausea or vomiting. 06/23/21   Clarnce Flock, MD  ? ? ?Review of Systems: ?Constitutional: Negative   ?HENT: Negative   ?Eyes: Negative   ?Respiratory: Negative   ?Cardiovascular: Negative   ?  Gastrointestinal: Negative  ?Genitourinary: neg for bloody show, neg for LOF   ?Musculoskeletal: Negative   ?Skin: Negative   ?Neurological: Negative   ?Endo/Heme/Allergies: Negative   ?Psychiatric/Behavioral: Negative  ? ? ?Physical Exam: ?VS: Blood pressure 120/70, pulse 68, temperature 97.8 ?F (36.6 ?C), temperature source Oral, resp. rate 17, SpO2 100 %. ?AAO x3, no signs of distress ?Cardiovascular: RRR ?Respiratory: Lung fields clear to ausculation ?GU/GI: Abdomen gravid, non-tender, non-distended, active FM, vertex, EFW 3000gram per Leopold's ?Extremities: No edema, negative for pain, tenderness, and cords ? ?Cervical  exam:Dilation: 5.5 ?Effacement (%): 80 ?Station: -2 ?Exam by:: Wilhemena Durie RN ?FHR: baseline rate 120 / variability moderate / accelerations present / absent decelerations ?TOCO: q 2 min ctx ? ?  ? ? ?Assessment: ?31 y.o. [id number] [redacted]w[redacted]d admitted for active labor ? ?active stage of labor ?FHR category 1 ?GBS neg ?Pain management plan: labor support ? ? ?Plan:  ?Admit to Labor and Delivery ?Routine admission orders ?Epidural on maternal demand ?IV hydration ?Continuous monitoring ?Anticipate NSVD delivery ? ? ?Sanjuana Kava MD ?08/06/2021 1:59 PM  ?

## 2021-08-07 LAB — CBC
HCT: 29.9 % — ABNORMAL LOW (ref 36.0–46.0)
Hemoglobin: 10.2 g/dL — ABNORMAL LOW (ref 12.0–15.0)
MCH: 29.1 pg (ref 26.0–34.0)
MCHC: 34.1 g/dL (ref 30.0–36.0)
MCV: 85.4 fL (ref 80.0–100.0)
Platelets: 148 10*3/uL — ABNORMAL LOW (ref 150–400)
RBC: 3.5 MIL/uL — ABNORMAL LOW (ref 3.87–5.11)
RDW: 13.7 % (ref 11.5–15.5)
WBC: 12.8 10*3/uL — ABNORMAL HIGH (ref 4.0–10.5)
nRBC: 0 % (ref 0.0–0.2)

## 2021-08-07 LAB — RPR: RPR Ser Ql: NONREACTIVE

## 2021-08-07 NOTE — Anesthesia Postprocedure Evaluation (Signed)
Anesthesia Post Note ? ?Patient: Emma Cruz ? ?Procedure(s) Performed: AN AD HOC LABOR EPIDURAL ? ?  ? ?Patient location during evaluation: Mother Baby ?Anesthesia Type: Epidural ?Level of consciousness: awake and alert ?Pain management: pain level controlled ?Vital Signs Assessment: post-procedure vital signs reviewed and stable ?Respiratory status: spontaneous breathing, nonlabored ventilation and respiratory function stable ?Cardiovascular status: stable ?Postop Assessment: no headache, no backache, epidural receding, no apparent nausea or vomiting, patient able to bend at knees, adequate PO intake and able to ambulate ?Anesthetic complications: no ? ? ?No notable events documented. ? ?Last Vitals:  ?Vitals:  ? 08/07/21 0450 08/07/21 0754  ?BP: 115/85 108/69  ?Pulse: 66 70  ?Resp: 18   ?Temp: 36.7 ?C 36.5 ?C  ?SpO2:  99%  ?  ?Last Pain:  ?Vitals:  ? 08/07/21 0754  ?TempSrc: Oral  ?PainSc: 0-No pain  ? ?Pain Goal:   ? ?  ?  ?  ?  ?  ?  ?  ? ?[name] ? ? ? ? ?

## 2021-08-07 NOTE — Plan of Care (Signed)
?  Problem: Education: ?Goal: Knowledge of General Education information will improve ?Description: Including pain rating scale, medication(s)/side effects and non-pharmacologic comfort measures ?Outcome: Completed/Met ?  ?Problem: Activity: ?Goal: Risk for activity intolerance will decrease ?Outcome: Completed/Met ?  ?Problem: Pain Managment: ?Goal: General experience of comfort will improve ?Outcome: Completed/Met ?  ?Problem: Safety: ?Goal: Ability to remain free from injury will improve ?Outcome: Completed/Met ?  ?Problem: Skin Integrity: ?Goal: Risk for impaired skin integrity will decrease ?Outcome: Completed/Met ?  ?

## 2021-08-07 NOTE — Social Work (Signed)
MOB was referred for history of depression and anxiety.  ? ?* Referral screened out by Clinical Social Worker because none of the following criteria appear to apply: ? ?~ History of anxiety/depression during this pregnancy, or of post-partum depression following prior delivery. ?~ Diagnosis of anxiety and/or depression within last 3 years. ?OR ?* MOB's symptoms currently being treated with medication and/or therapy. CSW reviewed chart and notes, MOB prescribed Klonopin .5mg  PRN, as well as Prozac 40mg  during pregnancy. Medications managed by her PCP.  ? ?MOB scored an 8 on the Edinburgh Postpartum Depression Scale.  ? ?Please contact the Clinical Social Worker if needs arise or by [facility] request. ? ?Darra Lis, LCSWA ?Clinical Social Work ?[facility] [place]  ?[phone]  ?

## 2021-08-07 NOTE — Lactation Note (Signed)
This note was copied from a baby's chart. ?Lactation Consultation Note ? ?Patient Name: Emma Cruz ?Today's Date: 08/07/2021 ?  ?Age:31 hours ?Per RN Orpah Clinton) mom declined [facility] services tonight. ?Maternal Data ?  ? ?Feeding ?  ? ?LATCH Score ?  ? ?  ? ?  ? ?  ? ?  ? ?  ? ? ?Lactation Tools Discussed/Used ?  ? ?Interventions ?  ? ?Discharge ?  ? ?Consult Status ?  ? ? ? ?Danelle Earthly ?08/07/2021, 11:51 PM ? ? ? ?

## 2021-08-07 NOTE — Progress Notes (Signed)
Post Partum Day 1 ?Subjective: ?Patient w/o complaints, doing well.  ?Denies heavy bleeding , no pain.  ? ?Objective: ?Blood pressure 108/69, pulse 70, temperature 97.7 ?F (36.5 ?C), temperature source Oral, resp. rate 18, height 5\' 5"  (1.651 m), weight 80.3 kg, SpO2 99 %, unknown if currently breastfeeding. ? ?Physical Exam:  ?General: alert, cooperative, and no distress ?Lochia: appropriate ?Uterine Fundus: firm U-1 ?DVT Evaluation: Negative Homan's sign. ?No cords or calf tenderness. ? ?Recent Labs  ?  08/06/21 ?1412 08/07/21 ?0507  ?HGB 12.8 10.2*  ?HCT 38.6 29.9*  ? ? ?Assessment/Plan: ?Plan for discharge tomorrow, Breastfeeding, and Circumcision prior to discharge ? ? LOS: 1 day  ? ?10/07/21 Sheriann Newmann ?08/07/2021, 9:04 AM  ? ? ?

## 2021-08-08 NOTE — Lactation Note (Signed)
This note was copied from a baby's chart. ?Lactation Consultation Note ? ?Patient Name: Emma Cruz ?Today's Date: 08/08/2021 ?Reason for consult: Follow-up assessment;Term ?Age:31 hours ? ? ?P2 mother whose infant is now 58 hours old.  This is a term baby at 39+5 weeks.  Mother breast fed her first child (now 46 years old) for 8 months.  Her current feeding preference is breast. ? ?Baby was in the nursery when I arrived.  Mother had no further questions/concerns related to breast feeding.  Last LATCH score was a 9; multiple voids/stools. Mother reported breast fullness today and has been a good milk producer in the past.  Reviewed engorgement prevention/treatment.  Family has our OP phone number for any concerns after discharge.  Father present. ? ?RN has already completed mother's discharge; awaiting pediatrician's discharge for baby. ? ? ?Maternal Data ?  ? ?Feeding ?Mother's Current Feeding Choice: Breast Milk ? ?LATCH Score ?  ? ?  ? ?  ? ?  ? ?  ? ?  ? ? ?Lactation Tools Discussed/Used ?  ? ?Interventions ?Interventions: Education ? ?Discharge ?Discharge Education: Engorgement and breast care ?Pump: Personal ? ?Consult Status ?Consult Status: Complete ?Date: 08/08/21 ?Follow-up type: Call as needed ? ? ? ?Ensley Blas R Traven Davids ?08/08/2021, 12:12 PM ? ? ? ?

## 2021-08-08 NOTE — Discharge Summary (Signed)
?   ?  OB Discharge Summary ? ?Patient Name: Emma Cruz ?DOB: [birth year] ?MRN: [id number] ? ?Date of admission: 08/06/2021 ?Intrauterine pregnancy: [redacted]w[redacted]d  ?Admitting diagnosis: Post-dates pregnancy [O48.0] ? ? ?Date of discharge: 08/08/2021    ?Discharge diagnosis: Term Pregnancy Delivered    ? ?Prenatal history: ?[id number]  ?EDC : 08/08/2021, Alternate EDD Entry  ?[facility] ?Prenatal course complicated by  ? ?Family history of congenital cardiac anomalies ?Fetal Tricuspid regurgitation ?Vitamin D deficiency ?Panic disorder, anxiety ?Depression  ? ?Prenatal Labs: ?ABO, Rh: --/--/O POS (03/04 1412) / Rhophylac  ?Antibody: NEG (03/04 1412) ?Rubella: Immune (07/29 0000)  / MMR booster  ?RPR: NON REACTIVE (03/04 1412)  ?HBsAg: Negative (07/29 0000)  ?HIV: Non-reactive (07/29 0000)  ?GBS: Negative/-- (02/08 0000)                                ?    ?Hospital course:  Onset of Labor With Vaginal Delivery      ?31y.o. yo [id number] 34w5das admitted in Active Labor on 08/06/2021. Patient had an uncomplicated labor course as follows:  ?Membrane Rupture Time/Date: 3:44 PM ,08/06/2021   ?Delivery Method:Vaginal, Spontaneous  ?Episiotomy: None  ?Lacerations:  None  ?Patient had an uncomplicated postpartum course.  She is ambulating, tolerating a regular diet, passing flatus, and urinating well. Patient is discharged home in stable condition on 08/08/21. ? ?Newborn Data: ?Birth date:08/06/2021  ?Birth time:4:14 PM  ?Gender:Female  ?Living status:Living  ?Apgars:8 ,9  ?Weight:3770 g  ?Delivering PROVIDER: PISanjuana Kava                                                          ?Complications: None ? ?Newborn Data: Live born female  ?Birth Weight: 8 lb 5 oz (3770 g) ?APGAR: 8, 9 ? ?Newborn Delivery   ?Birth date/time: 08/06/2021 16:14:00 ?Delivery type: Vaginal, Spontaneous ?  ?  ? ?Baby Feeding: Breast ?Disposition:home with mother ? ?Post partum procedures: none ? ?Labs: ?Lab Results  ?Component Value Date  ? WBC 12.8 (H)  08/07/2021  ? HGB 10.2 (L) 08/07/2021  ? HCT 29.9 (L) 08/07/2021  ? MCV 85.4 08/07/2021  ? PLT 148 (L) 08/07/2021  ? ?CMP Latest Ref Rng & Units 06/23/2021  ?Glucose 70 - 99 mg/dL 87  ?BUN 6 - 20 mg/dL 5(L)  ?Creatinine 0.44 - 1.00 mg/dL 0.62  ?Sodium 135 - 145 mmol/L 137  ?Potassium 3.5 - 5.1 mmol/L 3.7  ?Chloride 98 - 111 mmol/L 106  ?CO2 22 - 32 mmol/L 19(L)  ?Calcium 8.9 - 10.3 mg/dL 8.9  ?Total Protein 6.5 - 8.1 g/dL 6.0(L)  ?Total Bilirubin 0.3 - 1.2 mg/dL 1.1  ?Alkaline Phos 38 - 126 U/L 142(H)  ?AST 15 - 41 U/L 18  ?ALT 0 - 44 U/L 9  ? ? ?Physical Exam @ time of discharge:  ?Vitals:  ? 08/07/21 0754 08/07/21 1127 08/07/21 2127 08/08/21 0624  ?BP: 108/69 111/70 123/81 121/84  ?Pulse: 70 70 69 67  ?Resp:  17 17   ?Temp: 97.7 ?F (36.5 ?C)  98.2 ?F (36.8 ?C) 97.7 ?F (36.5 ?C)  ?TempSrc: Oral  Oral Oral  ?SpO2: 99% 97% 92% 98%  ?Weight:      ?  Height:      ? ?general: alert, cooperative, and no distress ?lochia: appropriate ?uterine fundus: firm ?perineum: intact ?extremities: mild edema ?DVT Evaluation: No evidence of DVT seen on physical exam. ? ?Discharge instructions:  "Baby and Me Booklet"  ?Discharge Medications:  ?Allergies as of 08/08/2021   ? ?   Reactions  ? Hydrocodone Hives, Nausea Only  ? Latex Rash  ? ?  ? ?  ?Medication List  ?  ? ?STOP taking these medications   ? ?calcium carbonate 500 MG chewable tablet ?Commonly known as: TUMS - dosed in mg elemental calcium ?  ?famotidine 20 MG tablet ?Commonly known as: PEPCID ?  ?ondansetron 4 MG disintegrating tablet ?Commonly known as: ZOFRAN-ODT ?  ?promethazine 25 MG tablet ?Commonly known as: PHENERGAN ?  ? ?  ? ?TAKE these medications   ? ?clonazePAM 0.5 MG tablet ?Commonly known as: KLONOPIN ?Take 0.5 mg by mouth 3 (three) times daily as needed for anxiety. ?  ?FLUoxetine 40 MG capsule ?Commonly known as: PROZAC ?Take 40 mg by mouth daily. ?  ?prenatal multivitamin Tabs tablet ?Take 1 tablet by mouth daily at 12 noon. ?  ? ?  ? ?Diet: routine  diet ?Activity: Advance as tolerated. Pelvic rest x 6 weeks.  ?Pateint with history of depression and anxiety. Well controlled with medications. Declines one week mood check. Long discussion of warning signs of perinatal mood disorder. Patient will reach out to [facility] with any issues.  ?Follow up:6 weeks ? ?Signed: ?Kathalene Frames MSN, CNM ?08/08/2021, 10:04 AM  ?

## 2021-08-15 ENCOUNTER — Inpatient Hospital Stay (HOSPITAL_COMMUNITY): Payer: 59

## 2021-08-15 ENCOUNTER — Inpatient Hospital Stay (HOSPITAL_COMMUNITY): Admission: AD | Admit: 2021-08-15 | Payer: 59 | Source: Home / Self Care | Admitting: Obstetrics & Gynecology

## 2021-08-16 ENCOUNTER — Telehealth (HOSPITAL_COMMUNITY): Payer: Self-pay | Admitting: *Deleted

## 2021-08-16 NOTE — Telephone Encounter (Signed)
Attempted hospital discharge follow-up call. Left message for patient to return RN call. Deforest Hoyles, RN, 08/16/21, [phone] ?

## 2021-09-28 ENCOUNTER — Emergency Department (HOSPITAL_COMMUNITY): Payer: Medicaid Other

## 2021-09-28 ENCOUNTER — Other Ambulatory Visit: Payer: Self-pay

## 2021-09-28 ENCOUNTER — Encounter (HOSPITAL_COMMUNITY): Payer: Self-pay

## 2021-09-28 ENCOUNTER — Emergency Department (HOSPITAL_COMMUNITY)
Admission: EM | Admit: 2021-09-28 | Discharge: 2021-09-28 | Discharge status: Against Medical Advice | Payer: Medicaid Other | Attending: Emergency Medicine | Admitting: Emergency Medicine

## 2021-09-28 DIAGNOSIS — Z5321 Procedure and treatment not carried out due to patient leaving prior to being seen by health care provider: Secondary | ICD-10-CM | POA: Insufficient documentation

## 2021-09-28 DIAGNOSIS — M25561 Pain in right knee: Secondary | ICD-10-CM | POA: Insufficient documentation

## 2021-09-28 DIAGNOSIS — Y92410 Unspecified street and highway as the place of occurrence of the external cause: Secondary | ICD-10-CM | POA: Diagnosis not present

## 2021-09-28 NOTE — ED Notes (Signed)
Name called in lobby 3x with no response ?
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
## 2022-10-19 ENCOUNTER — Other Ambulatory Visit: Payer: Self-pay

## 2022-10-19 ENCOUNTER — Encounter (HOSPITAL_COMMUNITY): Payer: Self-pay | Admitting: Family

## 2022-10-19 ENCOUNTER — Inpatient Hospital Stay (HOSPITAL_COMMUNITY)
Admission: AD | Admit: 2022-10-19 | Discharge: 2022-10-25 | DRG: 885 | Disposition: A | Discharge status: Home / Self Care | Payer: Medicaid Other | Source: Intra-hospital | Attending: Psychiatry | Admitting: Psychiatry

## 2022-10-19 DIAGNOSIS — Z79899 Other long term (current) drug therapy: Secondary | ICD-10-CM | POA: Diagnosis not present

## 2022-10-19 DIAGNOSIS — T424X2A Poisoning by benzodiazepines, intentional self-harm, initial encounter: Secondary | ICD-10-CM | POA: Diagnosis present

## 2022-10-19 DIAGNOSIS — K5900 Constipation, unspecified: Secondary | ICD-10-CM | POA: Diagnosis present

## 2022-10-19 DIAGNOSIS — G4700 Insomnia, unspecified: Secondary | ICD-10-CM | POA: Diagnosis present

## 2022-10-19 DIAGNOSIS — F332 Major depressive disorder, recurrent severe without psychotic features: Principal | ICD-10-CM | POA: Diagnosis present

## 2022-10-19 DIAGNOSIS — F419 Anxiety disorder, unspecified: Secondary | ICD-10-CM | POA: Diagnosis present

## 2022-10-19 DIAGNOSIS — R45851 Suicidal ideations: Secondary | ICD-10-CM | POA: Diagnosis present

## 2022-10-19 MED ORDER — ACETAMINOPHEN 325 MG PO TABS: 650.0000 mg | ORAL_TABLET | Freq: Four times a day (QID) | ORAL | Status: DC | PRN

## 2022-10-19 MED ORDER — LORAZEPAM 1 MG PO TABS: 2.0000 mg | ORAL_TABLET | Freq: Three times a day (TID) | ORAL | Status: DC | PRN

## 2022-10-19 MED ORDER — MAGNESIUM HYDROXIDE 400 MG/5ML PO SUSP
30.0000 mL | Freq: Every day | ORAL | Status: DC | PRN
Start: 2022-10-19 — End: 2022-10-25

## 2022-10-19 MED ORDER — HYDROXYZINE HCL 25 MG PO TABS
25.0000 mg | ORAL_TABLET | Freq: Three times a day (TID) | ORAL | Status: DC | PRN
  Administered 2022-10-20: 25 mg via ORAL
  Filled 2022-10-19 (×4): qty 1

## 2022-10-19 MED ORDER — HALOPERIDOL 5 MG PO TABS: 5.0000 mg | ORAL_TABLET | Freq: Three times a day (TID) | ORAL | Status: DC | PRN

## 2022-10-19 MED ORDER — TRAZODONE HCL 50 MG PO TABS
50.0000 mg | ORAL_TABLET | Freq: Every evening | ORAL | Status: DC | PRN
  Administered 2022-10-24: 50 mg via ORAL
  Filled 2022-10-19 (×3): qty 1

## 2022-10-19 MED ORDER — ALUM & MAG HYDROXIDE-SIMETH 200-200-20 MG/5ML PO SUSP: 30.0000 mL | ORAL | Status: DC | PRN

## 2022-10-19 MED ORDER — HALOPERIDOL LACTATE 5 MG/ML IJ SOLN: 5.0000 mg | Freq: Three times a day (TID) | INTRAMUSCULAR | Status: DC | PRN

## 2022-10-19 MED ORDER — LORAZEPAM 2 MG/ML IJ SOLN: 2.0000 mg | Freq: Three times a day (TID) | INTRAMUSCULAR | Status: DC | PRN

## 2022-10-19 MED ORDER — DIPHENHYDRAMINE HCL 50 MG/ML IJ SOLN: 50.0000 mg | Freq: Three times a day (TID) | INTRAMUSCULAR | Status: DC | PRN

## 2022-10-19 MED ORDER — DIPHENHYDRAMINE HCL 25 MG PO CAPS: 50.0000 mg | ORAL_CAPSULE | Freq: Three times a day (TID) | ORAL | Status: DC | PRN

## 2022-10-19 NOTE — Tx Team (Signed)
Initial Treatment Plan 10/19/2022 10:42 PM Emma Cruz [id number]    PATIENT STRESSORS: Financial difficulties   Health problems   Substance abuse     PATIENT STRENGTHS: Average or above average intelligence  Communication skills  Supportive family/friends    PATIENT IDENTIFIED PROBLEMS: Depression  Anxiety  " Coping with depression"  "Find right medications"               DISCHARGE CRITERIA:  Ability to meet basic life and health needs Improved stabilization in mood, thinking, and/or behavior Medical problems require only outpatient monitoring Motivation to continue treatment in a less acute level of care  PRELIMINARY DISCHARGE PLAN: Attend aftercare/continuing care group Attend PHP/IOP Outpatient therapy Return to previous living arrangement Return to previous work or school arrangements  PATIENT/FAMILY INVOLVEMENT: This treatment plan has been presented to and reviewed with the patient, Emma Cruz, and/or family member.  The patient and family have been given the opportunity to ask questions and make suggestions.  Bethann Punches, RN 10/19/2022, 10:42 PM
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
## 2022-10-19 NOTE — [facility] Group Notes (Signed)
[facility] Group Notes:  (Nursing/MHT/Case Management/Adjunct)  Date:  10/19/2022  Time:  9:43 PM  Type of Therapy:   Wrap-up group  Participation Level:  Didn't attend  Participation Quality:    Affect:    Cognitive:    Insight:    Engagement in Group:    Modes of Intervention:    Summary of Progress/Problems: Refused group.  Noah Delaine 10/19/2022, 9:43 PM

## 2022-10-20 ENCOUNTER — Encounter (HOSPITAL_COMMUNITY): Payer: Self-pay

## 2022-10-20 MED ORDER — ESCITALOPRAM OXALATE 10 MG PO TABS
10.0000 mg | ORAL_TABLET | Freq: Every day | ORAL | Status: DC
  Administered 2022-10-20 – 2022-10-21 (×2): 10 mg via ORAL
  Filled 2022-10-20 (×5): qty 1

## 2022-10-20 NOTE — [facility] Group Notes (Signed)
[facility] Group Notes:  (Nursing/MHT/Case Management/Adjunct)  Date:  10/20/2022  Time:  8:20 PM  Type of Therapy:   AA group  Participation Level:  Minimal  Participation Quality:  Appropriate  Affect:  Appropriate  Cognitive:  Appropriate  Insight:  Appropriate  Engagement in Group:  Engaged  Modes of Intervention:  Education  Summary of Progress/Problems: Attended AA meeting.  Emma Cruz 10/20/2022, 8:20 PM

## 2022-10-20 NOTE — BH IP Treatment Plan (Signed)
Interdisciplinary Treatment and Diagnostic Plan Update  10/20/2022 Time of Session: 1100 Emma Cruz MRN: [id number]  Principal Diagnosis: MDD (major depressive disorder), recurrent severe, without psychosis (HCC)  Secondary Diagnoses: Principal Problem:   MDD (major depressive disorder), recurrent severe, without psychosis (HCC)   Current Medications:  Current Facility-Administered Medications  Medication Dose Route Frequency Provider Last Rate Last Admin   acetaminophen (TYLENOL) tablet 650 mg  650 mg Oral Q6H PRN Lenard Lance, FNP       alum & mag hydroxide-simeth (MAALOX/MYLANTA) 200-200-20 MG/5ML suspension 30 mL  30 mL Oral Q4H PRN Lenard Lance, FNP       diphenhydrAMINE (BENADRYL) capsule 50 mg  50 mg Oral TID PRN Lenard Lance, FNP       Or   diphenhydrAMINE (BENADRYL) injection 50 mg  50 mg Intramuscular TID PRN Lenard Lance, FNP       haloperidol (HALDOL) tablet 5 mg  5 mg Oral TID PRN Lenard Lance, FNP       Or   haloperidol lactate (HALDOL) injection 5 mg  5 mg Intramuscular TID PRN Lenard Lance, FNP       hydrOXYzine (ATARAX) tablet 25 mg  25 mg Oral TID PRN Lenard Lance, FNP   25 mg at 10/20/22 0850   LORazepam (ATIVAN) tablet 2 mg  2 mg Oral TID PRN Lenard Lance, FNP       Or   LORazepam (ATIVAN) injection 2 mg  2 mg Intramuscular TID PRN Lenard Lance, FNP       magnesium hydroxide (MILK OF MAGNESIA) suspension 30 mL  30 mL Oral Daily PRN Lenard Lance, FNP       traZODone (DESYREL) tablet 50 mg  50 mg Oral QHS PRN Lenard Lance, FNP       PTA Medications: No medications prior to admission.    Patient Stressors: Financial difficulties   Health problems   Substance abuse    Patient Strengths: Average or above average intelligence  Communication skills  Supportive family/friends   Treatment Modalities: Medication Management, Group therapy, Case management,  1 to 1 session with clinician, Psychoeducation, Recreational therapy.   Physician Treatment  Plan for Primary Diagnosis: MDD (major depressive disorder), recurrent severe, without psychosis (HCC) Long Term Goal(s): Improvement in symptoms so as ready for discharge   Short Term Goals: Ability to identify changes in lifestyle to reduce recurrence of condition will improve Ability to verbalize feelings will improve Ability to disclose and discuss suicidal ideas Ability to demonstrate self-control will improve Ability to identify and develop effective coping behaviors will improve Ability to maintain clinical measurements within normal limits will improve Compliance with prescribed medications will improve Ability to identify triggers associated with substance abuse/mental health issues will improve  Medication Management: Evaluate patient's response, side effects, and tolerance of medication regimen.  Therapeutic Interventions: 1 to 1 sessions, Unit Group sessions and Medication administration.  Evaluation of Outcomes: Progressing  Physician Treatment Plan for Secondary Diagnosis: Principal Problem:   MDD (major depressive disorder), recurrent severe, without psychosis (HCC)  Long Term Goal(s): Improvement in symptoms so as ready for discharge   Short Term Goals: Ability to identify changes in lifestyle to reduce recurrence of condition will improve Ability to verbalize feelings will improve Ability to disclose and discuss suicidal ideas Ability to demonstrate self-control will improve Ability to identify and develop effective coping behaviors will improve Ability to maintain clinical measurements within normal limits will improve Compliance  with prescribed medications will improve Ability to identify triggers associated with substance abuse/mental health issues will improve     Medication Management: Evaluate patient's response, side effects, and tolerance of medication regimen.  Therapeutic Interventions: 1 to 1 sessions, Unit Group sessions and Medication  administration.  Evaluation of Outcomes: Progressing   RN Treatment Plan for Primary Diagnosis: MDD (major depressive disorder), recurrent severe, without psychosis (HCC) Long Term Goal(s): Knowledge of disease and therapeutic regimen to maintain health will improve  Short Term Goals: Ability to remain free from injury will improve, Ability to verbalize frustration and anger appropriately will improve, Ability to demonstrate self-control, Ability to participate in decision making will improve, Ability to verbalize feelings will improve, Ability to disclose and discuss suicidal ideas, Ability to identify and develop effective coping behaviors will improve, and Compliance with prescribed medications will improve  Medication Management: RN will administer medications as ordered by provider, will assess and evaluate patient's response and provide education to patient for prescribed medication. RN will report any adverse and/or side effects to prescribing provider.  Therapeutic Interventions: 1 on 1 counseling sessions, Psychoeducation, Medication administration, Evaluate responses to treatment, Monitor vital signs and CBGs as ordered, Perform/monitor CIWA, COWS, AIMS and Fall Risk screenings as ordered, Perform wound care treatments as ordered.  Evaluation of Outcomes: Progressing   LCSW Treatment Plan for Primary Diagnosis: MDD (major depressive disorder), recurrent severe, without psychosis (HCC) Long Term Goal(s): Safe transition to appropriate next level of care at discharge, Engage patient in therapeutic group addressing interpersonal concerns.  Short Term Goals: Engage patient in aftercare planning with referrals and resources, Increase social support, Increase ability to appropriately verbalize feelings, Increase emotional regulation, Facilitate acceptance of mental health diagnosis and concerns, Facilitate patient progression through stages of change regarding substance use diagnoses and  concerns, Identify triggers associated with mental health/substance abuse issues, and Increase skills for wellness and recovery  Therapeutic Interventions: Assess for all discharge needs, 1 to 1 time with Social worker, Explore available resources and support systems, Assess for adequacy in community support network, Educate family and significant other(s) on suicide prevention, Complete Psychosocial Assessment, Interpersonal group therapy.  Evaluation of Outcomes: Progressing   Progress in Treatment: Attending groups: Yes. Participating in groups: Yes. Taking medication as prescribed: Yes. Toleration medication: Yes. Family/Significant other contact made: No, will contact:  Kelle Darting [phone] Patient understands diagnosis: Yes. Discussing patient identified problems/goals with staff: Yes. Medical problems stabilized or resolved: Yes. Denies suicidal/homicidal ideation: Yes. Issues/concerns per patient self-inventory: Yes. Other: N/A  New problem(s) identified: No, Describe:  None reported  New Short Term/Long Term Goal(s):medication stabilization, elimination of SI thoughts, development of comprehensive mental wellness plan.   Patient Goals: Medication Stabilization  Discharge Plan or Barriers:   Reason for Continuation of CSW will continue to monitor Anxiety Depression Medication stabilization Suicidal ideation  Estimated Length of Stay: 3-7 Days  Last 3 [place] Suicide Severity Risk Score: Flowsheet Row Ad[facility] (Current) [facility] from [facility]28/2021 in [facility] at [facility] Admission (Discharged) from 08/06/2021 in [facility] 5S Mother Baby Unit  C-SSRS RISK CATEGORY No Risk No Risk No Risk       Last PHQ 2/9 Scores:     No data to display            medication stabilization, elimination of SI thoughts, development of comprehensive mental wellness plan.   Scribe for Treatment  Team: Ane Payment, LCSW 10/20/2022 3:04 PM

## 2022-10-20 NOTE — BHH Group Notes (Signed)
The focus of this group is to help patients establish daily goals to achieve during treatment and discuss how the patient can incorporate goal setting into their daily lives to aide in recovery.   Scale 3 out of 10   Goal: get home to children
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
## 2022-10-20 NOTE — Group Note (Signed)
Recreation Therapy Group Note   Group Topic:Leisure Education  Group Date: 10/20/2022 Start Time: 0935 End Time: 1009 Facilitators: Rima Blizzard-McCall, LRT,CTRS Location: [address] Dayroom   Goal Area(s) Addresses:  Patient will identify positive leisure and recreation activities.  Patient will identify one positive benefit of participation in leisure activities.   Group Description: Leisure Charades. Patients were divided in to 2 groups for game play. LRT used small strips of paper with 1 listed leisure or recreation activity. Patients took turns randomly drawing a slip of paper and acting out the identified activity without using words or sounds. Points were awarded to the team with the first correct guess. After several rounds of game play, team with the most points were declared the winners.   Affect/Mood: Flat   Participation Level: None   Participation Quality: None   Behavior: Reserved   Speech/Thought Process: Barely audible    Insight: None   Judgement: None   Modes of Intervention: Competitive Play   Patient Response to Interventions:  Disengaged   Education Outcome:  In group clarification offered    Clinical Observations/Individualized Feedback: Pt was quiet and reserved during group session.    Plan: Continue to engage patient in RT group sessions 2-3x/week.   Anthoni Geerts-McCall, LRT,CTRS 10/20/2022 12:14 PM

## 2022-10-20 NOTE — Progress Notes (Signed)
Pt presents tearful with anxious affect this morning. Pt reports missing her children. Pt endorses depression, denies suicidal thoughts. Pt medication compliant. Pt denies Hi and AVH also. Q 15 minute checks ongoing for safety.
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
## 2022-10-21 LAB — CBC WITH DIFFERENTIAL/PLATELET
Abs Immature Granulocytes: 0.02 10*3/uL (ref 0.00–0.07)
Basophils Absolute: 0 10*3/uL (ref 0.0–0.1)
Basophils Relative: 0 %
Eosinophils Absolute: 0 10*3/uL (ref 0.0–0.5)
Eosinophils Relative: 0 %
HCT: 43.1 % (ref 36.0–46.0)
Hemoglobin: 14.3 g/dL (ref 12.0–15.0)
Immature Granulocytes: 0 %
Lymphocytes Relative: 30 %
Lymphs Abs: 2 10*3/uL (ref 0.7–4.0)
MCH: 29.2 pg (ref 26.0–34.0)
MCHC: 33.2 g/dL (ref 30.0–36.0)
MCV: 88.1 fL (ref 80.0–100.0)
Monocytes Absolute: 0.4 10*3/uL (ref 0.1–1.0)
Monocytes Relative: 6 %
Neutro Abs: 4.2 10*3/uL (ref 1.7–7.7)
Neutrophils Relative %: 64 %
Platelets: 268 10*3/uL (ref 150–400)
RBC: 4.89 MIL/uL (ref 3.87–5.11)
RDW: 12.3 % (ref 11.5–15.5)
WBC: 6.6 10*3/uL (ref 4.0–10.5)
nRBC: 0 % (ref 0.0–0.2)

## 2022-10-21 LAB — COMPREHENSIVE METABOLIC PANEL
ALT: 10 U/L (ref 0–44)
AST: 15 U/L (ref 15–41)
Albumin: 4.1 g/dL (ref 3.5–5.0)
Alkaline Phosphatase: 76 U/L (ref 38–126)
Anion gap: 6 (ref 5–15)
BUN: 11 mg/dL (ref 6–20)
CO2: 28 mmol/L (ref 22–32)
Calcium: 9 mg/dL (ref 8.9–10.3)
Chloride: 103 mmol/L (ref 98–111)
Creatinine, Ser: 0.87 mg/dL (ref 0.44–1.00)
GFR, Estimated: >60 mL/min (ref >60)
Glucose, Bld: 98 mg/dL (ref 70–99)
Potassium: 4.4 mmol/L (ref 3.5–5.1)
Sodium: 137 mmol/L (ref 135–145)
Total Bilirubin: 0.7 mg/dL (ref 0.3–1.2)
Total Protein: 7.2 g/dL (ref 6.5–8.1)

## 2022-10-21 LAB — LIPID PANEL
Cholesterol: 119 mg/dL (ref 0–200)
HDL: 40 mg/dL — ABNORMAL LOW (ref >40)
LDL Cholesterol: 64 mg/dL (ref 0–99)
Total CHOL/HDL Ratio: 3 RATIO
Triglycerides: 76 mg/dL (ref <150)
VLDL: 15 mg/dL (ref 0–40)

## 2022-10-21 LAB — HEMOGLOBIN A1C
Hgb A1c MFr Bld: 4.7 % — ABNORMAL LOW (ref 4.8–5.6)
Mean Plasma Glucose: 88.19 mg/dL

## 2022-10-21 LAB — PREGNANCY, URINE: Preg Test, Ur: NEGATIVE

## 2022-10-21 MED ORDER — ESCITALOPRAM OXALATE 20 MG PO TABS
20.0000 mg | ORAL_TABLET | Freq: Every day | ORAL | Status: DC
  Administered 2022-10-22 – 2022-10-25 (×4): 20 mg via ORAL
  Filled 2022-10-21 (×5): qty 1

## 2022-10-21 NOTE — Progress Notes (Signed)
Pt is medication compliant, attending groups and social on unit. Pt denies suicidal thoughts, continues to report missing her children. Pt denies HI as well as AVH. Q 15 minute checks ongoing.

## 2022-10-21 NOTE — Progress Notes (Signed)
   10/21/22 2223  Psych Admission Type (Psych Patients Only)  Admission Status Voluntary  Psychosocial Assessment  Patient Complaints Anxiety  Eye Contact Fair  Facial Expression Sad  Affect Appropriate to circumstance  Speech Logical/coherent  Interaction Assertive  Motor Activity Other (Comment) (WDL)  Appearance/Hygiene Unremarkable  Behavior Characteristics Appropriate to situation  Mood Depressed;Pleasant  Thought Process  Coherency WDL  Content WDL  Delusions None reported or observed  Perception WDL  Hallucination None reported or observed  Judgment Poor  Confusion None  Danger to Self  Current suicidal ideation? Denies  Agreement Not to Harm Self Yes  Description of Agreement verbal  Danger to Others  Danger to Others None reported or observed

## 2022-10-21 NOTE — Progress Notes (Signed)
The patient rated her day as a 7 or 8 out of 10. She mentioned that she received some bad news while talking to her husband on the phone, but did not go into further detail. Her positive event for the day is that she socialized.

## 2022-10-21 NOTE — Progress Notes (Signed)
   10/21/22 0054  Psych Admission Type (Psych Patients Only)  Admission Status Voluntary  Psychosocial Assessment  Patient Complaints Anxiety  Eye Contact Fair  Facial Expression Flat  Affect Appropriate to circumstance  Speech Logical/coherent  Interaction Assertive  Motor Activity Other (Comment) (WDL)  Appearance/Hygiene Unremarkable  Behavior Characteristics Appropriate to situation  Mood Depressed;Pleasant  Thought Process  Coherency WDL  Content WDL  Delusions None reported or observed  Perception WDL  Hallucination None reported or observed  Judgment Poor  Confusion None  Danger to Self  Current suicidal ideation? Denies  Agreement Not to Harm Self Yes  Description of Agreement verbal  Danger to Others  Danger to Others None reported or observed

## 2022-10-21 NOTE — Group Note (Signed)
Date:  10/21/2022 Time:  6:42 PM  Group Topic/Focus:  Making Healthy Choices:   The focus of this group is to help patients identify negative/unhealthy choices they were using prior to admission and identify positive/healthier coping strategies to replace them upon discharge.    Participation Level:  Active  Participation Quality:  Appropriate  Affect:  Appropriate  Cognitive:  Appropriate  Insight: Appropriate  Engagement in Group:  Engaged  Modes of Intervention:  Exploration  Additional Comments:     Reymundo Poll 10/21/2022, 6:42 PM

## 2022-10-21 NOTE — Group Note (Unsigned)
Date:  10/21/2022 Time:  6:41 PM  Group Topic/Focus:  Orientation:   The focus of this group is to educate the patient on the purpose and policies of crisis stabilization and provide a format to answer questions about their admission.  The group details unit policies and expectations of patients while admitted.     Participation Level:  {BHH PARTICIPATION ZOXWR:60454}  Participation Quality:  {BHH PARTICIPATION QUALITY:22265}  Affect:  {BHH AFFECT:22266}  Cognitive:  {BHH COGNITIVE:22267}  Insight: {BHH Insight2:20797}  Engagement in Group:  {BHH ENGAGEMENT IN UJWJX:91478}  Modes of Intervention:  {BHH MODES OF INTERVENTION:22269}  Additional Comments:  ***  Reymundo Poll 10/21/2022, 6:41 PM

## 2022-10-21 NOTE — Progress Notes (Signed)
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 She was assessed and recommended for inpatient treatment, transferred to [facility].      24 hour chart Review: Staff report patient was tearful during evening reporting 'missing her children', more visible on milieu attending unit groups and acitivities.  Received PRN Hydroxyzine for anxiety. Slept throughout the night without any issues. Labs obtained and were WNL; HDL 40, HgBA1C 4.7. UDS-. Vital signs WNL.      Assessment: Patient observed in dayroom playing cards with peers, smiling and talking. She is casually dressed; appropriate detail to ADLs. Calm and cooperative. Mood more euthymic with some tearing up but no crying as observed day prior. Affect congruent. She reports feeling 'much better. Some people were able to talk me out of my shell last night so I feel a little better'.   During assessment she describes feeling overwhelmed in the home taking care of the children with minimal help from husband. Says she has not felt able to express her feelings to her husband. States she is unable to pinpoint a specific trigger to the attempt but knows she felt 'overwhelmed and saw no other way out at the moment'.  Today she rates her depression 3/10 on a scale of 0-10 with 10 being highest severity that she relates to being 'homesick and missing my kids', denies any anxiety. She reports her appetite has 'picked up since yesterday'. Has been attending meals, groups and activities. Reports overall 'feeling better'. Husband visited last night and she reports ongoing communication with her mother that she describes has been 'going good'. She  expresses regret for her attempt and states she 'wishes I would've just talked it out with my husband'; states plan to 'flush' out all old prescriptions and continue with out patient services to assist her work through her ongoing issues upon discharge. Reports mother-in-law is another support who she plans on 'leaning' on more once discharged to prevent her from holding in so many of her feelings. Provider discussed plan to increase current medication to target symptoms of depression in which she is amenable to. She denies SI/HI/AVH and no signs of paranoia or delusional thought. Contracts for safety. Care reviewed with Attending MD and plan noted below.   Principal Problem: MDD (major depressive disorder), recurrent severe, without psychosis (HCC) Diagnosis: Principal Problem:   MDD (major depressive disorder), recurrent severe, without psychosis (HCC)  Total Time spent with patient: 30 minutes  Past Psychiatric History: anxiety, depression  Past Medical History:  Past Medical History:  Diagnosis Date   Anxiety    Depression     Past Surgical History:  Procedure Laterality Date   CHOLECYSTECTOMY     SCAR REVISION     on left jaw   TONSILLECTOMY     Family History:  Family History  Problem Relation Age of Onset   Diabetes Mother    Cancer Father        thyroid   Family Psychiatric  History: see H&P Social History:  Social History   Substance and Sexual Activity  Alcohol Use Not Currently     Social History   Substance and Sexual Activity  Drug Use Never    Social History  Socioeconomic History   Marital status: Single    Spouse name: Not on file   Number of children: Not on file   Years of education: Not on file   Highest education level: Not on file  Occupational History   Not on file  Tobacco Use   Smoking status: Never   Smokeless tobacco: Never  Vaping Use   Vaping Use: Never used  Substance and Sexual Activity   Alcohol use: Not Currently   Drug use:  Never   Sexual activity: Yes  Other Topics Concern   Not on file  Social History Narrative   Not on file   Social Determinants of Health   Financial Resource Strain: Not on file  Food Insecurity: No Food Insecurity (10/19/2022)   Hunger Vital Sign    Worried About Running Out of Food in the Last Year: Never true    Ran Out of Food in the Last Year: Never true  Transportation Needs: No Transportation Needs (10/19/2022)   PRAPARE - Administrator, Civil Service (Medical): No    Lack of Transportation (Non-Medical): No  Physical Activity: Not on file  Stress: Not on file  Social Connections: Not on file   Additional Social History:   Sleep: Fair  Appetite:  Fair  Current Medications: Current Facility-Administered Medications  Medication Dose Route Frequency Provider Last Rate Last Admin   acetaminophen (TYLENOL) tablet 650 mg  650 mg Oral Q6H PRN Lenard Lance, FNP       alum & mag hydroxide-simeth (MAALOX/MYLANTA) 200-200-20 MG/5ML suspension 30 mL  30 mL Oral Q4H PRN Lenard Lance, FNP       diphenhydrAMINE (BENADRYL) capsule 50 mg  50 mg Oral TID PRN Lenard Lance, FNP       Or   diphenhydrAMINE (BENADRYL) injection 50 mg  50 mg Intramuscular TID PRN Lenard Lance, FNP       escitalopram (LEXAPRO) tablet 10 mg  10 mg Oral Daily Leevy-Johnson, Clair Alfieri A, NP   10 mg at 10/21/22 1610   haloperidol (HALDOL) tablet 5 mg  5 mg Oral TID PRN Lenard Lance, FNP       Or   haloperidol lactate (HALDOL) injection 5 mg  5 mg Intramuscular TID PRN Lenard Lance, FNP       hydrOXYzine (ATARAX) tablet 25 mg  25 mg Oral TID PRN Lenard Lance, FNP   25 mg at 10/20/22 0850   LORazepam (ATIVAN) tablet 2 mg  2 mg Oral TID PRN Lenard Lance, FNP       Or   LORazepam (ATIVAN) injection 2 mg  2 mg Intramuscular TID PRN Lenard Lance, FNP       magnesium hydroxide (MILK OF MAGNESIA) suspension 30 mL  30 mL Oral Daily PRN Lenard Lance, FNP       traZODone (DESYREL) tablet 50 mg  50 mg  Oral QHS PRN Lenard Lance, FNP        Lab Results:  Results for orders placed or performed during the hospital encounter of 10/19/22 (from the past 48 hour(s))  Comprehensive metabolic panel     Status: None   Collection Time: 10/21/22  6:50 AM  Result Value Ref Range   Sodium 137 135 - 145 mmol/L   Potassium 4.4 3.5 - 5.1 mmol/L   Chloride 103 98 - 111 mmol/L   CO2 28 22 - 32 mmol/L   Glucose, Bld 98 70 - 99 mg/dL  Comment: Glucose reference range applies only to samples taken after fasting for at least 8 hours.   BUN 11 6 - 20 mg/dL   Creatinine, Ser 1.61 0.44 - 1.00 mg/dL   Calcium 9.0 8.9 - 09.6 mg/dL   Total Protein 7.2 6.5 - 8.1 g/dL   Albumin 4.1 3.5 - 5.0 g/dL   AST 15 15 - 41 U/L   ALT 10 0 - 44 U/L   Alkaline Phosphatase 76 38 - 126 U/L   Total Bilirubin 0.7 0.3 - 1.2 mg/dL   GFR, Estimated >04 >54 mL/min    Comment: (NOTE) Calculated using the CKD-EPI Creatinine Equation (2021)    Anion gap 6 5 - 15    Comment: Performed at [facility], [address], [place], [place] [address]  CBC with Differential/Platelet     Status: None   Collection Time: 10/21/22  6:50 AM  Result Value Ref Range   WBC 6.6 4.0 - 10.5 K/uL   RBC 4.89 3.87 - 5.11 MIL/uL   Hemoglobin 14.3 12.0 - 15.0 g/dL   HCT 91.4 78.2 - 95.6 %   MCV 88.1 80.0 - 100.0 fL   MCH 29.2 26.0 - 34.0 pg   MCHC 33.2 30.0 - 36.0 g/dL   RDW 21.3 08.6 - 57.8 %   Platelets 268 150 - 400 K/uL   nRBC 0.0 0.0 - 0.2 %   Neutrophils Relative % 64 %   Neutro Abs 4.2 1.7 - 7.7 K/uL   Lymphocytes Relative 30 %   Lymphs Abs 2.0 0.7 - 4.0 K/uL   Monocytes Relative 6 %   Monocytes Absolute 0.4 0.1 - 1.0 K/uL   Eosinophils Relative 0 %   Eosinophils Absolute 0.0 0.0 - 0.5 K/uL   Basophils Relative 0 %   Basophils Absolute 0.0 0.0 - 0.1 K/uL   Immature Granulocytes 0 %   Abs Immature Granulocytes 0.02 0.00 - 0.07 K/uL    Comment: Performed at [facility], [address],  [place], [place] [address]  Lipid panel     Status: Abnormal   Collection Time: 10/21/22  6:50 AM  Result Value Ref Range   Cholesterol 119 0 - 200 mg/dL   Triglycerides 76 <952 mg/dL   HDL 40 (L) >84 mg/dL   Total CHOL/HDL Ratio 3.0 RATIO   VLDL 15 0 - 40 mg/dL   LDL Cholesterol 64 0 - 99 mg/dL    Comment:        Total Cholesterol/HDL:CHD Risk Coronary Heart Disease Risk Table                     Men   Women  1/2 Average Risk   3.4   3.3  Average Risk       5.0   4.4  2 X Average Risk   9.6   7.1  3 X Average Risk  23.4   11.0        Use the calculated Patient Ratio above and the CHD Risk Table to determine the patient's CHD Risk.        ATP III CLASSIFICATION (LDL):  <100     mg/dL   Optimal  132-440  mg/dL   Near or Above                    Optimal  130-159  mg/dL   Borderline  102-725  mg/dL   High  >366     mg/dL  Very High Performed at [facility], [address], [place], [place] [address]   Pregnancy, urine     Status: None   Collection Time: 10/21/22  7:23 AM  Result Value Ref Range   Preg Test, Ur NEGATIVE NEGATIVE    Comment:        THE SENSITIVITY OF THIS METHODOLOGY IS >20 mIU/mL. Performed at [facility], [address], [place], [place] [address]     Blood Alcohol level:  No results found for: "ETH"  Metabolic Disorder Labs: No results found for: "HGBA1C", "MPG" No results found for: "PROLACTIN" Lab Results  Component Value Date   CHOL 119 10/21/2022   TRIG 76 10/21/2022   HDL 40 (L) 10/21/2022   CHOLHDL 3.0 10/21/2022   VLDL 15 10/21/2022   LDLCALC 64 10/21/2022    Physical Findings: AIMS:  , ,  ,  ,    CIWA:    COWS:     Musculoskeletal: Strength & Muscle Tone: within normal limits Gait & Station: normal Patient leans: N/A  Psychiatric Specialty Exam:  Presentation  General Appearance:  Appropriate for Environment; Casual  Eye Contact: Good  Speech: Normal Rate  Speech  Volume: Normal  Handedness: Right   Mood and Affect  Mood: Euthymic  Affect: Appropriate; Congruent   Thought Process  Thought Processes: Goal Directed  Descriptions of Associations:Intact  Orientation:Full (Time, Place and Person)  Thought Content:Logical  History of Schizophrenia/Schizoaffective disorder:No data recorded Duration of Psychotic Symptoms:No data recorded Hallucinations:Hallucinations: None  Ideas of Reference:None  Suicidal Thoughts:Suicidal Thoughts: No  Homicidal Thoughts:Homicidal Thoughts: No   Sensorium  Memory: Immediate Good; Recent Good  Judgment: Fair  Insight: Fair   Art therapist  Concentration: Fair  Attention Span: Fair  Recall: [employer] of Knowledge: Fair  Language: Fair   Psychomotor Activity  Psychomotor Activity: Psychomotor Activity: Normal   Assets  Assets: Communication Skills; Desire for Improvement; Resilience; Social Support; English as a second language teacher; Advertising copywriter; Physical Health; Housing; Financial Resources/Insurance   Sleep  Sleep: Sleep: Fair    Physical Exam: Physical Exam Vitals and nursing note reviewed.  Constitutional:      Appearance: She is normal weight.  HENT:     Head: Normocephalic.     Nose: Nose normal.  Pulmonary:     Effort: Pulmonary effort is normal.  Skin:    General: Skin is warm and dry.  Neurological:     Mental Status: She is alert and oriented to person, place, and time.  Psychiatric:        Attention and Perception: Attention and perception normal.        Mood and Affect: Mood and affect normal.        Speech: Speech normal.        Behavior: Behavior normal. Behavior is cooperative.        Thought Content: Thought content normal. Thought content is not paranoid or delusional. Thought content does not include homicidal or suicidal ideation. Thought content does not include homicidal or suicidal plan.        Cognition and Memory: Cognition and  memory normal.        Judgment: Judgment normal.    Review of Systems  Psychiatric/Behavioral:  Positive for depression.    Blood pressure 103/64, pulse 81, temperature (!) 97.5 F (36.4 C), temperature source Oral, resp. rate 16, height 5\' 5"  (1.651 m), weight 65 kg, SpO2 99 %, unknown if currently breastfeeding. Body mass index is 23.83 kg/m.   Treatment Plan  Summary: Daily contact with patient to assess and evaluate symptoms and progress in treatment, Medication management, and Plan   PLAN:  Medications:  Increase:  Escitalopram 20 mg daily for depressive symptoms; from 10 mg daily   Agitation Orders: Diphenhydramine 50 mg IM/PO, Haldol 5 mg IM/PO, Lorazepam 2mg  IM/PO TID PRN agitation   PRNs:  Acetaminophen 650 mg every 6 as needed/mild pain Hydroxyzine 25 mg PO TID PRN anxiety Maalox 30 mL oral every 4 as needed/digestion Magnesium hydroxide 30 mL daily as needed/mild constipation Trazodone 50 mg p.o. daily at bedtime PRN for insomnia     Observation Level/Precautions:  15 minute checks  Laboratory:  CBC Chemistry Profile Folic Acid HbAIC HCG UDS UA UDS  Psychotherapy:  milieu, group  Medications:  see MAR  Consultations:  social work  Discharge Concerns:  safety  Estimated LOS: 5-7 days  Other:      Physician Treatment Plan for Primary Diagnosis: MDD (major depressive disorder), recurrent severe, without psychosis (HCC) Long Term Goal(s): Improvement in symptoms so as ready for discharge   Short Term Goals: Ability to identify changes in lifestyle to reduce recurrence of condition will improve, Ability to verbalize feelings will improve, Ability to disclose and discuss suicidal ideas, Ability to demonstrate self-control will improve, Ability to identify and develop effective coping behaviors will improve, Ability to maintain clinical measurements within normal limits will improve, Compliance with prescribed medications will improve, and Ability to identify  triggers associated with substance abuse/mental health issues will improve   Physician Treatment Plan for Secondary Diagnosis: Principal Problem:   MDD (major depressive disorder), recurrent severe, without psychosis (HCC)   Long Term Goal(s): Improvement in symptoms so as ready for discharge   Short Term Goals: Ability to identify changes in lifestyle to reduce recurrence of condition will improve, Ability to verbalize feelings will improve, Ability to disclose and discuss suicidal ideas, Ability to demonstrate self-control will improve, Ability to identify and develop effective coping behaviors will improve, Ability to maintain clinical measurements within normal limits will improve, Compliance with prescribed medications will improve, and Ability to identify triggers associated with substance abuse/mental health issues will improve    Loletta Parish, NP 10/21/2022, 11:53 AM
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
## 2022-10-22 DIAGNOSIS — F332 Major depressive disorder, recurrent severe without psychotic features: Principal | ICD-10-CM

## 2022-10-22 NOTE — [facility] Suicide Risk Assessment (Signed)
[facility] INPATIENT:  Family/Significant Other Suicide Prevention Education  Suicide Prevention Education:  Education Completed; Emma Cruz,  (significant other) has been identified by the patient as the family member/significant other with whom the patient will be residing, and identified as the person(s) who will aid the patient in the event of a mental health crisis (suicidal ideations/suicide attempt).  With written consent from the patient, the family member/significant other has been provided the following suicide prevention education, prior to the and/or following the discharge of the patient.  The suicide prevention education provided includes the following: Suicide risk factors Suicide prevention and interventions National Suicide Hotline telephone number [facility] assessment telephone number [facility] Emergency Assistance 911 [facility] and/or Residential Mobile Crisis Unit telephone number  Request made of family/significant other to: Remove weapons (e.g., guns, rifles, knives), all items previously/currently identified as safety concern.   Remove drugs/medications (over-the-counter, prescriptions, illicit drugs), all items previously/currently identified as a safety concern.  The family member/significant other verbalizes understanding of the suicide prevention education information provided.  The family member/significant other agrees to remove the items of safety concern listed above.  Emma Cruz 10/22/2022, 3:05 PM

## 2022-10-22 NOTE — Progress Notes (Signed)
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 She was assessed and recommended for inpatient treatment, transferred to [facility].      24 hour chart Review: Staff report patient remains more visible in milieu attending unit groups and acitivities. Brighter affect. No PRN medications given. Slept throughout the night without any issues. Slept overnight. Vital signs WNL.      Assessment: Patient observed in dayroom playing cards with peers, smiling and talking. She is casually dressed; appropriate detail to ADLs. Calm and cooperative. Mood appears more depressed and sad, somewhat tearful. Affect congruent. She reports during visitation last night with spouse he expressed not wanting her back home in fear she may overdose again in front of the kids. States he told her he 'doesn't trust me with the kids'. She has expressed disappointment that he decided to do it while she was hospitalized. Is somewhat tearful, however was able to keep her composure throughout the assessment. States the two aren't legally married and he isn't the father of her oldest child. Has been in contact with her best friend who is assisting her with securing housing and focusing on her children.    She endorses good sleep quality, reports 'with getting sleep and taking medications I feel more like herself than I have in a long time'. She is verbalizing futuristic thinking as she is reportedly looking for housing, starting work full time, and planning on being 'the best mom I can be to my kids'. She identifies her best friend Cheree as her strongest support and that biological mother as volunteered as a support  given the current situation. She endorses 'feeling sad and disappointed' but denies any depression stating 'I see a light at the end of the tunnel so I'm working towards that'.   Today she rates her depression 0-1/10 on a scale of 0-10 with 10 being highest severity. Stating 'its going to be a new chapter but it's going to be a positive chapter'. She continues to attend meals where she reports consistent improvement of appetite and is attending unit groups and activities.  She denies SI/HI/AVH and no signs of paranoia or delusional thought. Contracts for safety. She is currently taking Lexpro 20 mg increased from 10 mg yesterday with no reported side effects possibly increase or augment with Abilify if depression worsens; continues to utilize PRN Hydroxyzine as needed for anxiety. Care reviewed with Attending MD and plan noted below.   Collateral: Kelle Darting (significant other) [phone]  prefers not to contact   Edison Pace (best friend) [phone] no answer    Principal Problem: MDD (major depressive disorder), recurrent severe, without psychosis (HCC) Diagnosis: Principal Problem:   MDD (major depressive disorder), recurrent severe, without psychosis (HCC)  Total Time spent with patient: 30 minutes  Past Psychiatric History: anxiety, depression  Past Medical History:  Past Medical History:  Diagnosis Date   Anxiety    Depression     Past Surgical History:  Procedure Laterality Date   CHOLECYSTECTOMY     SCAR REVISION     on left jaw   TONSILLECTOMY     Family History:  Family History  Problem Relation Age of Onset   Diabetes Mother  Cancer Father        thyroid   Family Psychiatric  History: see H&P Social History:  Social History   Substance and Sexual Activity  Alcohol Use Not Currently     Social History   Substance and Sexual Activity  Drug Use Never    Social History   Socioeconomic History   Marital status: Single    Spouse name: Not on file    Number of children: Not on file   Years of education: Not on file   Highest education level: Not on file  Occupational History   Not on file  Tobacco Use   Smoking status: Never   Smokeless tobacco: Never  Vaping Use   Vaping Use: Never used  Substance and Sexual Activity   Alcohol use: Not Currently   Drug use: Never   Sexual activity: Yes  Other Topics Concern   Not on file  Social History Narrative   Not on file   Social Determinants of Health   Financial Resource Strain: Not on file  Food Insecurity: No Food Insecurity (10/19/2022)   Hunger Vital Sign    Worried About Running Out of Food in the Last Year: Never true    Ran Out of Food in the Last Year: Never true  Transportation Needs: No Transportation Needs (10/19/2022)   PRAPARE - Administrator, Civil Service (Medical): No    Lack of Transportation (Non-Medical): No  Physical Activity: Not on file  Stress: Not on file  Social Connections: Not on file   Additional Social History:   Sleep: Fair  Appetite:  Fair  Current Medications: Current Facility-Administered Medications  Medication Dose Route Frequency Provider Last Rate Last Admin   acetaminophen (TYLENOL) tablet 650 mg  650 mg Oral Q6H PRN Lenard Lance, FNP       alum & mag hydroxide-simeth (MAALOX/MYLANTA) 200-200-20 MG/5ML suspension 30 mL  30 mL Oral Q4H PRN Lenard Lance, FNP       diphenhydrAMINE (BENADRYL) capsule 50 mg  50 mg Oral TID PRN Lenard Lance, FNP       Or   diphenhydrAMINE (BENADRYL) injection 50 mg  50 mg Intramuscular TID PRN Lenard Lance, FNP       escitalopram (LEXAPRO) tablet 20 mg  20 mg Oral Daily Leevy-Johnson, Alrick Cubbage A, NP   20 mg at 10/22/22 0729   haloperidol (HALDOL) tablet 5 mg  5 mg Oral TID PRN Lenard Lance, FNP       Or   haloperidol lactate (HALDOL) injection 5 mg  5 mg Intramuscular TID PRN Lenard Lance, FNP       hydrOXYzine (ATARAX) tablet 25 mg  25 mg Oral TID PRN Lenard Lance, FNP   25 mg at 10/20/22  0850   LORazepam (ATIVAN) tablet 2 mg  2 mg Oral TID PRN Lenard Lance, FNP       Or   LORazepam (ATIVAN) injection 2 mg  2 mg Intramuscular TID PRN Lenard Lance, FNP       magnesium hydroxide (MILK OF MAGNESIA) suspension 30 mL  30 mL Oral Daily PRN Lenard Lance, FNP       traZODone (DESYREL) tablet 50 mg  50 mg Oral QHS PRN Lenard Lance, FNP        Lab Results:  Results for orders placed or performed during the hospital encounter of 10/19/22 (from the past 48 hour(s))  Comprehensive metabolic panel  Status: None   Collection Time: 10/21/22  6:50 AM  Result Value Ref Range   Sodium 137 135 - 145 mmol/L   Potassium 4.4 3.5 - 5.1 mmol/L   Chloride 103 98 - 111 mmol/L   CO2 28 22 - 32 mmol/L   Glucose, Bld 98 70 - 99 mg/dL    Comment: Glucose reference range applies only to samples taken after fasting for at least 8 hours.   BUN 11 6 - 20 mg/dL   Creatinine, Ser 1.61 0.44 - 1.00 mg/dL   Calcium 9.0 8.9 - 09.6 mg/dL   Total Protein 7.2 6.5 - 8.1 g/dL   Albumin 4.1 3.5 - 5.0 g/dL   AST 15 15 - 41 U/L   ALT 10 0 - 44 U/L   Alkaline Phosphatase 76 38 - 126 U/L   Total Bilirubin 0.7 0.3 - 1.2 mg/dL   GFR, Estimated >04 >54 mL/min    Comment: (NOTE) Calculated using the CKD-EPI Creatinine Equation (2021)    Anion gap 6 5 - 15    Comment: Performed at [facility], [address], [place], [place] [address]  CBC with Differential/Platelet     Status: None   Collection Time: 10/21/22  6:50 AM  Result Value Ref Range   WBC 6.6 4.0 - 10.5 K/uL   RBC 4.89 3.87 - 5.11 MIL/uL   Hemoglobin 14.3 12.0 - 15.0 g/dL   HCT 91.4 78.2 - 95.6 %   MCV 88.1 80.0 - 100.0 fL   MCH 29.2 26.0 - 34.0 pg   MCHC 33.2 30.0 - 36.0 g/dL   RDW 21.3 08.6 - 57.8 %   Platelets 268 150 - 400 K/uL   nRBC 0.0 0.0 - 0.2 %   Neutrophils Relative % 64 %   Neutro Abs 4.2 1.7 - 7.7 K/uL   Lymphocytes Relative 30 %   Lymphs Abs 2.0 0.7 - 4.0 K/uL   Monocytes Relative 6 %   Monocytes  Absolute 0.4 0.1 - 1.0 K/uL   Eosinophils Relative 0 %   Eosinophils Absolute 0.0 0.0 - 0.5 K/uL   Basophils Relative 0 %   Basophils Absolute 0.0 0.0 - 0.1 K/uL   Immature Granulocytes 0 %   Abs Immature Granulocytes 0.02 0.00 - 0.07 K/uL    Comment: Performed at [facility], [address], [place], [place] [address]  Lipid panel     Status: Abnormal   Collection Time: 10/21/22  6:50 AM  Result Value Ref Range   Cholesterol 119 0 - 200 mg/dL   Triglycerides 76 <952 mg/dL   HDL 40 (L) >84 mg/dL   Total CHOL/HDL Ratio 3.0 RATIO   VLDL 15 0 - 40 mg/dL   LDL Cholesterol 64 0 - 99 mg/dL    Comment:        Total Cholesterol/HDL:CHD Risk Coronary Heart Disease Risk Table                     Men   Women  1/2 Average Risk   3.4   3.3  Average Risk       5.0   4.4  2 X Average Risk   9.6   7.1  3 X Average Risk  23.4   11.0        Use the calculated Patient Ratio above and the CHD Risk Table to determine the patient's CHD Risk.        ATP III CLASSIFICATION (LDL):  <100  mg/dL   Optimal  098-119  mg/dL   Near or Above                    Optimal  130-159  mg/dL   Borderline  147-829  mg/dL   High  >562     mg/dL   Very High Performed at [facility], [address], [place], [place] [address]   Hemoglobin A1c     Status: Abnormal   Collection Time: 10/21/22  6:50 AM  Result Value Ref Range   Hgb A1c MFr Bld 4.7 (L) 4.8 - 5.6 %    Comment: (NOTE) Pre diabetes:          5.7%-6.4%  Diabetes:              >6.4%  Glycemic control for   <7.0% adults with diabetes    Mean Plasma Glucose 88.19 mg/dL    Comment: Performed at [facility], [address], [place], [place] [address]  Pregnancy, urine     Status: None   Collection Time: 10/21/22  7:23 AM  Result Value Ref Range   Preg Test, Ur NEGATIVE NEGATIVE    Comment:        THE SENSITIVITY OF THIS METHODOLOGY IS >20 mIU/mL. Performed at [facility], [address] [place], [place] [address]     Blood Alcohol level:  No results found for: "ETH"  Metabolic Disorder Labs: Lab Results  Component Value Date   HGBA1C 4.7 (L) 10/21/2022   MPG 88.19 10/21/2022   No results found for: "PROLACTIN" Lab Results  Component Value Date   CHOL 119 10/21/2022   TRIG 76 10/21/2022   HDL 40 (L) 10/21/2022   CHOLHDL 3.0 10/21/2022   VLDL 15 10/21/2022   LDLCALC 64 10/21/2022    Physical Findings: AIMS:  , ,  ,  ,    CIWA:    COWS:     Musculoskeletal: Strength & Muscle Tone: within normal limits Gait & Station: normal Patient leans: N/A  Psychiatric Specialty Exam:  Presentation  General Appearance:  Appropriate for Environment; Casual  Eye Contact: Good  Speech: Normal Rate  Speech Volume: Normal  Handedness: Right   Mood and Affect  Mood: Euthymic  Affect: Appropriate; Congruent   Thought Process  Thought Processes: Goal Directed  Descriptions of Associations:Intact  Orientation:Full (Time, Place and Person)  Thought Content:Logical  History of Schizophrenia/Schizoaffective disorder:No data recorded Duration of Psychotic Symptoms:No data recorded Hallucinations:Hallucinations: None  Ideas of Reference:None  Suicidal Thoughts:Suicidal Thoughts: No  Homicidal Thoughts:Homicidal Thoughts: No   Sensorium  Memory: Immediate Good; Recent Good  Judgment: Fair  Insight: Fair   Art therapist  Concentration: Fair  Attention Span: Fair  Recall: [employer] of Knowledge: Fair  Language: Fair   Psychomotor Activity  Psychomotor Activity: Psychomotor Activity: Normal   Assets  Assets: Communication Skills; Desire for Improvement; Resilience; Social Support; English as a second language teacher; Advertising copywriter; Physical Health; Housing; Financial Resources/Insurance   Sleep  Sleep: Sleep: Fair    Physical Exam: Physical Exam Vitals and nursing note reviewed.  Constitutional:       Appearance: She is normal weight.  HENT:     Head: Normocephalic.     Nose: Nose normal.  Pulmonary:     Effort: Pulmonary effort is normal.  Skin:    General: Skin is warm and dry.  Neurological:     Mental Status: She is alert and oriented to person, place, and time.  Psychiatric:        Attention and Perception: Attention and perception normal.        Mood and Affect: Mood is depressed. Affect is tearful.        Speech: Speech normal.        Behavior: Behavior normal. Behavior is cooperative.        Thought Content: Thought content normal. Thought content is not paranoid or delusional. Thought content does not include homicidal or suicidal ideation. Thought content does not include homicidal or suicidal plan.        Cognition and Memory: Cognition and memory normal.        Judgment: Judgment normal.    Review of Systems  Psychiatric/Behavioral:  Positive for depression.    Blood pressure 97/72, pulse 95, temperature 98.2 F (36.8 C), temperature source Oral, resp. rate 16, height 5\' 5"  (1.651 m), weight 65 kg, SpO2 98 %, unknown if currently breastfeeding. Body mass index is 23.83 kg/m.   Treatment Plan Summary: Daily contact with patient to assess and evaluate symptoms and progress in treatment, Medication management, and Plan   PLAN:  Medications:  Continue:  Escitalopram 20 mg daily for depressive symptoms; from 10 mg daily   Agitation Orders: Diphenhydramine 50 mg IM/PO, Haldol 5 mg IM/PO, Lorazepam 2mg  IM/PO TID PRN agitation   PRNs:  Acetaminophen 650 mg every 6 as needed/mild pain Hydroxyzine 25 mg PO TID PRN anxiety Maalox 30 mL oral every 4 as needed/digestion Magnesium hydroxide 30 mL daily as needed/mild constipation Trazodone 50 mg p.o. daily at bedtime PRN for insomnia     Observation Level/Precautions:  15 minute checks  Laboratory:  CBC Chemistry Profile Folic Acid HbAIC HCG UDS UA UDS  Psychotherapy:  milieu, group  Medications:  see MAR   Consultations:  social work  Discharge Concerns:  safety  Estimated LOS: 5-7 days  Other:      Physician Treatment Plan for Primary Diagnosis: MDD (major depressive disorder), recurrent severe, without psychosis (HCC) Long Term Goal(s): Improvement in symptoms so as ready for discharge   Short Term Goals: Ability to identify changes in lifestyle to reduce recurrence of condition will improve, Ability to verbalize feelings will improve, Ability to disclose and discuss suicidal ideas, Ability to demonstrate self-control will improve, Ability to identify and develop effective coping behaviors will improve, Ability to maintain clinical measurements within normal limits will improve, Compliance with prescribed medications will improve, and Ability to identify triggers associated with substance abuse/mental health issues will improve   Physician Treatment Plan for Secondary Diagnosis: Principal Problem:   MDD (major depressive disorder), recurrent severe, without psychosis (HCC)   Long Term Goal(s): Improvement in symptoms so as ready for discharge   Short Term Goals: Ability to identify changes in lifestyle to reduce recurrence of condition will improve, Ability to verbalize feelings will improve, Ability to disclose and discuss suicidal ideas, Ability to demonstrate self-control will improve, Ability to identify and develop effective coping behaviors will improve, Ability to maintain clinical measurements within normal limits will improve, Compliance with prescribed medications will improve, and Ability to identify triggers associated with substance abuse/mental health issues will improve    Loletta Parish, NP 10/22/2022, 8:41 AMPatient ID: Pincus Badder, female   DOB: Nov 12, 1990, 32 y.o.   MRN: [id number]

## 2022-10-22 NOTE — Progress Notes (Signed)
   10/22/22 0800  Psych Admission Type (Psych Patients Only)  Admission Status Voluntary  Psychosocial Assessment  Patient Complaints None  Eye Contact Fair  Facial Expression Animated  Affect Appropriate to circumstance  Speech Logical/coherent  Interaction Assertive  Motor Activity Other (Comment) (WDL)  Appearance/Hygiene Unremarkable  Behavior Characteristics Appropriate to situation  Mood Pleasant  Thought Process  Coherency WDL  Content WDL  Delusions None reported or observed  Perception WDL  Hallucination None reported or observed  Judgment Poor  Confusion None  Danger to Self  Current suicidal ideation? Denies  Agreement Not to Harm Self Yes  Description of Agreement verbal  Danger to Others  Danger to Others None reported or observed   Pt calm, cooperative and pleasant. Denies SI/HI/AVH.

## 2022-10-22 NOTE — Progress Notes (Signed)
   10/22/22 2309  Psych Admission Type (Psych Patients Only)  Admission Status Voluntary  Psychosocial Assessment  Patient Complaints None  Eye Contact Fair  Facial Expression Animated  Affect Appropriate to circumstance  Speech Logical/coherent  Interaction Assertive  Motor Activity Other (Comment) (WDL)  Appearance/Hygiene Unremarkable  Behavior Characteristics Appropriate to situation  Mood Pleasant  Thought Process  Coherency WDL  Content WDL  Delusions None reported or observed  Perception WDL  Judgment Poor  Danger to Self  Current suicidal ideation? Denies  Agreement Not to Harm Self Yes  Description of Agreement verbal  Danger to Others  Danger to Others None reported or observed

## 2022-10-22 NOTE — [facility] Group Notes (Signed)
Adult Psychoeducational Group Note  Date:  10/22/2022 Time:  8:56 PM  Group Topic/Focus:  Wrap-Up Group:   The focus of this group is to help patients review their daily goal of treatment and discuss progress on daily workbooks.  Participation Level:  Active  Participation Quality:  Appropriate  Affect:  Appropriate  Cognitive:  Appropriate  Insight: Appropriate  Engagement in Group:  Engaged  Modes of Intervention:  Discussion and Support  Additional Comments:  Pt attended the evening group and responded to all discussion prompts from the Writer. Pt shared that today was a good day on the unit, the highlight of which was learning of her upcoming discharge, which she felt positive about. On the subject of goals for the coming week, Pt mentioned figuring out her housing situation. "I feel like I'll be in a good place if I can get that taken care of." Pt rated her day a 7 out of 10.  Christ Kick 10/22/2022, 8:56 PM

## 2022-10-23 MED ORDER — ARIPIPRAZOLE 2 MG PO TABS
2.0000 mg | ORAL_TABLET | Freq: Every day | ORAL | Status: DC
  Administered 2022-10-23 – 2022-10-25 (×3): 2 mg via ORAL
  Filled 2022-10-23 (×4): qty 1

## 2022-10-23 NOTE — Plan of Care (Signed)
  Problem: Education: Goal: Knowledge of [place] Education information/materials will improve Outcome: Progressing Goal: Emotional status will improve Outcome: Progressing Goal: Mental status will improve Outcome: Progressing Goal: Verbalization of understanding the information provided will improve Outcome: Progressing   Problem: Coping: Goal: Coping ability will improve Outcome: Progressing Goal: Will verbalize feelings Outcome: Progressing   

## 2022-10-23 NOTE — Group Note (Signed)
Date:  10/23/2022 Time:  11:33 AM  Group Topic/Focus:  Goals Group:   The focus of this group is to help patients establish daily goals to achieve during treatment and discuss how the patient can incorporate goal setting into their daily lives to aide in recovery.    Participation Level:  Active  Participation Quality:  Appropriate  Affect:  Appropriate  Cognitive:  Appropriate  Insight: Appropriate  Engagement in Group:  Engaged  Modes of Intervention:  Education  Additional Comments:     Reymundo Poll 10/23/2022, 11:33 AM

## 2022-10-23 NOTE — Progress Notes (Addendum)
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 She was assessed and recommended for inpatient treatment, transferred to [facility].    24-hour chart Review: Past 24 hours of patient's chart was reviewed.  Patient is compliant with scheduled meds. Required Agitation PRNs: none Per RN notes, no documented behavioral issues and is attending group. Patient slept, 8 hours  Assessment: Aundra Millet is seen and examined in the office sitting up in a chair.  She is alert and oriented to person, place, time & situation.  Chart reviewed and findings shared with the treatment team and consult with attending psychiatrist.  She is visible on the unit and observed in the day room associating with peers. She is casually dressed, calm and cooperative. Mood appears sad and depressed.  She rates depression as #5/10, with 10 being of highest severity.  Initiated Abilify 2 mg p.o. daily to augment her antidepressants of Lexapro 20 mg p.o. daily.  Reports anxiety as 3/10, with 10 being of highest severity.  Encouraged patient to the nursing staff for as needed for anxiety.  Reports taking her medication as scheduled without any somatic discomfort.   She expresses that she is shocked and depressed that her significant other does not support her, and plans to separate while she is in the hospital.  When asked what led to his decision reports, "he expressed not wanting me back home in fear I may overdose again in front of the kids and he 'doesn't trust me with the kids."  Patient  reported to this provider that her significant other and herself are not legally married, that they only attended a ceremony and no papers signed since the past 7 years.  Reports she has 2 children 42 years old and 69-year-old.  Reports that the 73-year-old belongs to another man, and the 55-year-old belongs to both of them.    She endorses good sleep quality and sleeping up to 8 hours last night.   She denies SI/HI/AVH and no signs of paranoia or delusional thought.  She is able to contracts for safety.  Vital signs reviewed within normal limits.  Will continue with current treatment plan.  Collateral: Kelle Darting (significant other) [phone]  prefers not to contact  Edison Pace (best friend) [phone] no answer today 10/23/2022  Principal Problem: MDD (major depressive disorder), recurrent severe, without psychosis (HCC) Diagnosis: Principal Problem:   MDD (major depressive disorder), recurrent severe, without psychosis (HCC)  Total Time spent with patient: 30 minutes  Past Psychiatric History: anxiety, depression  Past Medical History:  Past Medical History:  Diagnosis Date   Anxiety    Depression     Past Surgical History:  Procedure Laterality Date   CHOLECYSTECTOMY     SCAR REVISION     on left jaw   TONSILLECTOMY     Family History:  Family History  Problem Relation Age of Onset   Diabetes Mother    Cancer Father        thyroid   Family Psychiatric  History: see H&P Social History:  Social History   Substance  and Sexual Activity  Alcohol Use Not Currently     Social History   Substance and Sexual Activity  Drug Use Never    Social History   Socioeconomic History   Marital status: Single    Spouse name: Not on file   Number of children: Not on file   Years of education: Not on file   Highest education level: Not on file  Occupational History   Not on file  Tobacco Use   Smoking status: Never   Smokeless tobacco: Never  Vaping Use   Vaping Use:  Never used  Substance and Sexual Activity   Alcohol use: Not Currently   Drug use: Never   Sexual activity: Yes  Other Topics Concern   Not on file  Social History Narrative   Not on file   Social Determinants of Health   Financial Resource Strain: Not on file  Food Insecurity: No Food Insecurity (10/19/2022)   Hunger Vital Sign    Worried About Running Out of Food in the Last Year: Never true    Ran Out of Food in the Last Year: Never true  Transportation Needs: No Transportation Needs (10/19/2022)   PRAPARE - Administrator, Civil Service (Medical): No    Lack of Transportation (Non-Medical): No  Physical Activity: Not on file  Stress: Not on file  Social Connections: Not on file   Additional Social History:   Sleep: Fair  Appetite:  Fair  Current Medications: Current Facility-Administered Medications  Medication Dose Route Frequency Provider Last Rate Last Admin   acetaminophen (TYLENOL) tablet 650 mg  650 mg Oral Q6H PRN Lenard Lance, FNP       alum & mag hydroxide-simeth (MAALOX/MYLANTA) 200-200-20 MG/5ML suspension 30 mL  30 mL Oral Q4H PRN Lenard Lance, FNP       ARIPiprazole (ABILIFY) tablet 2 mg  2 mg Oral Daily Sehaj Kolden, Jesusita Oka, FNP       diphenhydrAMINE (BENADRYL) capsule 50 mg  50 mg Oral TID PRN Lenard Lance, FNP       Or   diphenhydrAMINE (BENADRYL) injection 50 mg  50 mg Intramuscular TID PRN Lenard Lance, FNP       escitalopram (LEXAPRO) tablet 20 mg  20 mg Oral Daily Leevy-Johnson, Brooke A, NP   20 mg at 10/23/22 0736   haloperidol (HALDOL) tablet 5 mg  5 mg Oral TID PRN Lenard Lance, FNP       Or   haloperidol lactate (HALDOL) injection 5 mg  5 mg Intramuscular TID PRN Lenard Lance, FNP       hydrOXYzine (ATARAX) tablet 25 mg  25 mg Oral TID PRN Lenard Lance, FNP   25 mg at 10/20/22 0850   LORazepam (ATIVAN) tablet 2 mg  2 mg Oral TID PRN Lenard Lance, FNP       Or   LORazepam (ATIVAN) injection 2 mg  2 mg Intramuscular TID PRN Lenard Lance, FNP       magnesium hydroxide (MILK OF MAGNESIA) suspension 30 mL  30 mL Oral Daily PRN Lenard Lance, FNP       traZODone (DESYREL) tablet 50 mg  50 mg Oral QHS PRN Lenard Lance, FNP        Lab Results:  No results found for this or any previous visit (from the past 48 hour(s)).   Blood Alcohol level:  No results found for: "ETH"  Metabolic Disorder Labs: Lab  Results  Component Value Date   HGBA1C 4.7 (L) 10/21/2022   MPG 88.19 10/21/2022   No results found for: "PROLACTIN" Lab Results  Component Value Date   CHOL 119 10/21/2022   TRIG 76 10/21/2022   HDL 40 (L) 10/21/2022   CHOLHDL 3.0 10/21/2022   VLDL 15 10/21/2022   LDLCALC 64 10/21/2022    Physical Findings: AIMS:  , ,  ,  ,    CIWA:    COWS:     Musculoskeletal: Strength & Muscle Tone: within normal limits Gait & Station: normal Patient leans: N/A  Psychiatric Specialty Exam:  Presentation  General Appearance:  Appropriate for Environment; Casual; Fairly Groomed  Eye Contact: Good  Speech: Clear and Coherent; Normal Rate  Speech Volume: Normal  Handedness: Right  Mood and Affect  Mood: Depressed  Affect: Congruent; Depressed  Thought Process  Thought Processes: Goal Directed; Coherent  Descriptions of Associations:Intact  Orientation:Full (Time, Place and Person)  Thought Content:Logical  History of Schizophrenia/Schizoaffective disorder:No data recorded Duration of Psychotic Symptoms:No data recorded Hallucinations:Hallucinations: None  Ideas of Reference:None  Suicidal Thoughts:Suicidal Thoughts: No  Homicidal Thoughts:Homicidal Thoughts: No   Sensorium  Memory: Immediate Good; Immediate Poor  Judgment: Fair  Insight: Fair  Chartered certified accountant: Fair  Attention Span: Fair  Recall: Fair  Fund of Knowledge: Fair  Language: Good  Psychomotor Activity  Psychomotor Activity: Psychomotor Activity: Normal  Assets   Assets: Communication Skills; Desire for Improvement; Physical Health; Resilience  Sleep  Sleep: Sleep: Good Number of Hours of Sleep: 8  Physical Exam: Physical Exam Vitals and nursing note reviewed.  Constitutional:      Appearance: She is normal weight.  HENT:     Head: Normocephalic.     Nose: Nose normal.     Mouth/Throat:     Mouth: Mucous membranes are moist.  Eyes:     Pupils: Pupils are equal, round, and reactive to light.  Cardiovascular:     Rate and Rhythm: Normal rate.     Pulses: Normal pulses.  Pulmonary:     Effort: Pulmonary effort is normal.  Genitourinary:    Comments: Deferred Musculoskeletal:        General: Normal range of motion.     Cervical back: Normal range of motion.  Skin:    General: Skin is warm and dry.  Neurological:     General: No focal deficit present.     Mental Status: She is alert and oriented to person, place, and time.  Psychiatric:        Attention and Perception: Attention and perception normal.        Mood and Affect: Mood is depressed. Affect is tearful.        Speech: Speech normal.        Behavior: Behavior normal. Behavior is cooperative.        Thought Content: Thought content normal. Thought content is not paranoid or delusional. Thought content does not include homicidal or suicidal ideation. Thought content does not include homicidal or suicidal plan.        Cognition and Memory: Cognition and memory normal.     Comments: Depression and sadness    Review of Systems  Constitutional:  Negative for fever.  HENT:  Negative for sore throat.   Eyes: Negative.   Respiratory:  Negative for shortness of breath.   Cardiovascular: Negative.   Gastrointestinal:  Negative for nausea and vomiting.  Genitourinary:  Negative for dysuria.  Musculoskeletal: Negative.   Skin:  Negative.   Neurological: Negative.   Endo/Heme/Allergies: Negative.   Psychiatric/Behavioral:  Positive for depression. The patient is nervous/anxious.     Blood pressure 107/80, pulse 81, temperature 98.4 F (36.9 C), temperature source Oral, resp. rate 16, height 5\' 5"  (1.651 m), weight 65 kg, SpO2 98 %, unknown if currently breastfeeding. Body mass index is 23.83 kg/m.  Treatment Plan Summary: Daily contact with patient to assess and evaluate symptoms and progress in treatment, Medication management, and Plan   PLAN:  Medications:  Continue:  -Escitalopram 20 mg daily for depressive symptoms; from 10 mg daily -Initiate Abilify 2 mg p.o. daily to augment antidepressant  Agitation Orders: Diphenhydramine 50 mg IM/PO, Haldol 5 mg IM/PO, Lorazepam 2mg  IM/PO TID PRN agitation   PRNs:  Acetaminophen 650 mg every 6 as needed/mild pain Hydroxyzine 25 mg PO TID PRN anxiety Maalox 30 mL oral every 4 as needed/digestion Magnesium hydroxide 30 mL daily as needed/mild constipation Trazodone 50 mg p.o. daily at bedtime PRN for insomnia    Physician Treatment Plan for Primary Diagnosis: MDD (major depressive disorder), recurrent severe, without psychosis (HCC) Long Term Goal(s): Improvement in symptoms so as ready for discharge   Short Term Goals: Ability to identify changes in lifestyle to reduce recurrence of condition will improve, Ability to verbalize feelings will improve, Ability to disclose and discuss suicidal ideas, Ability to demonstrate self-control will improve, Ability to identify and develop effective coping behaviors will improve, Ability to maintain clinical measurements within normal limits will improve, Compliance with prescribed medications will improve, and Ability to identify triggers associated with substance abuse/mental health issues will improve   Physician Treatment Plan for Secondary Diagnosis: Principal Problem:   MDD (major depressive disorder), recurrent severe, without psychosis (HCC)   Long Term Goal(s): Improvement in symptoms so as ready for discharge   Short Term Goals: Ability to identify changes in lifestyle  to reduce recurrence of condition will improve, Ability to verbalize feelings will improve, Ability to disclose and discuss suicidal ideas, Ability to demonstrate self-control will improve, Ability to identify and develop effective coping behaviors will improve, Ability to maintain clinical measurements within normal limits will improve, Compliance with prescribed medications will improve, and Ability to identify triggers associated with substance abuse/mental health issues will improve  Cecilie Lowers, FNP 10/23/2022, 12:32 PMPatient ID: Pincus Badder, female   DOB: 19-Mar-1991, 32 y.o.   MRN: [id number] Patient ID: NANCEE KARAHALIOS, female   DOB: 27-Sep-1990, 32 y.o.   MRN: [id number]

## 2022-10-23 NOTE — Group Note (Signed)
Date:  10/23/2022 Time:  11:36 AM  Group Topic/Focus:  Managing Feelings:   The focus of this group is to identify what feelings patients have difficulty handling and develop a plan to handle them in a healthier way upon discharge.    Participation Level:  Active  Participation Quality:  Appropriate  Affect:  Appropriate  Cognitive:  Appropriate  Insight: Appropriate  Engagement in Group:  Engaged  Modes of Intervention:  Exploration  Additional Comments:     Reymundo Poll 10/23/2022, 11:36 AM

## 2022-10-23 NOTE — [facility] Group Notes (Signed)
Spirituality group facilitated by Kathleen Argue, BCC.  Group Description: Group focused on topic of hope. Patients participated in facilitated discussion around topic, connecting with one another around experiences and definitions for hope. Group members engaged with visual explorer photos, reflecting on what hope looks like for them today. Group engaged in discussion around how their definitions of hope are present today in hospital.  Modalities: Psycho-social ed, Adlerian, Narrative, MI  Patient Progress: Emma Cruz attended group and actively engaged and participated in group conversation and activities. She shared about wanting to be a good mom and to the mom her kids deserved.  She became tearful several times during group, but was able to remain engaged and contributed to conversation in positive ways.

## 2022-10-23 NOTE — Progress Notes (Signed)
Chaplain provided follow up support to [facility] following group and provided listening as she shared about her husband's response to her suicide attempt.  She feels determined to work on her own healing so that she can be there for her children and for herself.  The conversation was cut short by a phone call that she received.  Chaplain will attempt follow up tomorrow.

## 2022-10-23 NOTE — Progress Notes (Signed)
   10/23/22 0800  Psych Admission Type (Psych Patients Only)  Admission Status Voluntary  Psychosocial Assessment  Patient Complaints None  Eye Contact Fair  Facial Expression Animated  Affect Appropriate to circumstance  Speech Logical/coherent  Interaction Assertive  Motor Activity Other (Comment) (WDL)  Appearance/Hygiene Unremarkable  Behavior Characteristics Appropriate to situation  Mood Pleasant  Thought Process  Coherency WDL  Content WDL  Delusions None reported or observed  Perception WDL  Hallucination None reported or observed  Judgment Poor  Confusion None  Danger to Self  Current suicidal ideation? Denies  Agreement Not to Harm Self Yes  Description of Agreement Verbal  Danger to Others  Danger to Others None reported or observed

## 2022-10-24 NOTE — [facility] Group Notes (Signed)
[facility] Group Notes:  (Nursing/MHT/Case Management/Adjunct)  Date:  10/24/2022  Time:  4:17 AM  Type of Therapy:   AA group  Participation Level:  Minimal  Participation Quality:  Appropriate  Affect:  Appropriate  Cognitive:  Appropriate  Insight:  Appropriate  Engagement in Group:  Engaged  Modes of Intervention:  Education  Summary of Progress/Problems: Attended AA group.  Emma Cruz 10/24/2022, 4:17 AM

## 2022-10-24 NOTE — Group Note (Signed)
LCSW Group Therapy Note  Group Date: 10/24/2022 Start Time: 1100 End Time: 1200   Type of Therapy and Topic:  Group Therapy - How To Cope with Nervousness about Discharge   Participation Level:  Active   Description of Group This process group involved identification of patients' feelings about discharge. Some of them are scheduled to be discharged soon, while others are new admissions, but each of them was asked to share thoughts and feelings surrounding discharge from the hospital. One common theme was that they are excited at the prospect of going home, while another was that many of them are apprehensive about sharing why they were hospitalized. Patients were given the opportunity to discuss these feelings with their peers in preparation for discharge.  Therapeutic Goals  Patient will identify their overall feelings about pending discharge. Patient will think about how they might proactively address issues that they believe will once again arise once they get home (i.e. with parents). Patients will participate in discussion about having hope for change.   Summary of Patient Progress:  She was very active throughout the session. She demonstrated good insight into the subject matter, and proved open to input from peers and feedback from CSW. She was respectful of peers and participated throughout the entire session.   Therapeutic Modalities Cognitive Behavioral Therapy   Khayman Kirsch M Earnestene Angello, LCSWA 10/24/2022  12:00 PM   

## 2022-10-24 NOTE — Progress Notes (Addendum)
D. Pt is friendly upon approach- has been visible in the milieu, observed interacting well with peers and attending groups. Per pt's self inventory, pt rated her depression,hopelessness and anxiety all 0's. Pt's stated goal today is "to work on finalizing my discharge plan so that I can leave tomorrow."  Pt denies SI/HI and AVH and reports that she is tolerating her medications well  A. Labs and vitals monitored. Pt given and educated on medications. Pt supported emotionally and encouraged to express concerns and ask questions.   R. Pt remains safe with 15 minute checks. Will continue POC.    10/24/22 1100  Psych Admission Type (Psych Patients Only)  Admission Status Voluntary  Psychosocial Assessment  Patient Complaints None  Eye Contact Fair  Facial Expression Animated  Affect Appropriate to circumstance  Speech Logical/coherent  Interaction Assertive  Motor Activity Other (Comment) (steady gait)  Appearance/Hygiene Unremarkable  Behavior Characteristics Cooperative;Appropriate to situation  Mood Pleasant  Thought Process  Coherency WDL  Content WDL  Delusions None reported or observed  Perception WDL  Hallucination None reported or observed  Judgment Poor  Confusion None  Danger to Self  Current suicidal ideation? Denies  Agreement Not to Harm Self Yes  Description of Agreement verbal contract for safety  Danger to Others  Danger to Others None reported or observed

## 2022-10-24 NOTE — Group Note (Signed)
Recreation Therapy Group Note   Group Topic:Animal Assisted Therapy   Group Date: 10/24/2022 Start Time: 0945 End Time: 1030 Facilitators: Gorden Stthomas, Benito Mccreedy, LRT Location: [address] Dayroom  Animal-Assisted Activity (AAA) Program Checklist/Progress Note Patient Eligibility Criteria Checklist & Daily Group note for Rec Tx Intervention   AAA/T Program Assumption of Risk Form signed by Patient/ or Parent Legal Guardian YES  Patient is free of allergies or severe asthma  YES  Patient reports no fear of animals YES  Patient reports no history of cruelty to animals YES  Patient understands their participation is voluntary YES  Patient washes hands before animal contact YES  Patient washes hands after animal contact YES   Group Description: Patients provided opportunity to interact with trained and credentialed [employer] Therapy dog and the community volunteer/dog handler. Patients practiced appropriate animal interaction and were educated on dog safety outside of the hospital in common community settings. Patients were allowed to use dog toys and other items to practice commands, engage the dog in play, and/or complete routine aspects of animal care.   Education: Charity fundraiser, Health visitor, Communication & Social Skills   Affect/Mood: Congruent and Euthymic   Participation Level: Engaged   Participation Quality: Independent   Behavior: Appropriate, Cooperative, and Interactive    Speech/Thought Process: Coherent, Directed, and Relevant   Insight: Good   Judgement: Good   Modes of Intervention: Activity, Teaching laboratory technician, and Socialization   Patient Response to Interventions:  Interested  and Receptive   Education Outcome:  Acknowledges education   Clinical Observations/Individualized Feedback: Emma Cruz appropriately pet the visiting therapy dog, Bella during AAA programming. Pt expressed that they have a Boxer named Sylie and 3 cats as pets at  home. Pt was pleasant and interactive with peers and Teaching laboratory technician, asking questions and sharing stories about personal experiences with animals.    Benito Mccreedy Mehmet Scally, LRT, CTRS 10/24/2022 3:49 PM

## 2022-10-24 NOTE — Progress Notes (Signed)
   10/23/22 2200  Psych Admission Type (Psych Patients Only)  Admission Status Voluntary  Psychosocial Assessment  Patient Complaints None  Eye Contact Fair  Facial Expression Animated  Affect Appropriate to circumstance  Speech Logical/coherent  Interaction Assertive  Motor Activity Other (Comment) (WDL)  Appearance/Hygiene Unremarkable  Behavior Characteristics Appropriate to situation  Mood Pleasant  Thought Process  Coherency WDL  Content WDL  Delusions None reported or observed  Perception WDL  Hallucination None reported or observed  Judgment Poor  Confusion None  Danger to Self  Current suicidal ideation? Denies  Agreement Not to Harm Self Yes  Description of Agreement Verbally contracts for safety.  Danger to Others  Danger to Others None reported or observed   Patient alert and oriented. Presenting appropriate to circumstance with a pleasant mood. Patient denies SI, HI, AVH, and pain.Patient denies anxiety and depression at this time. Support and encouragement provided. Routine safety checks conducted every 15 minutes. Patient verbally contracts for safety and remains safe on the unit.

## 2022-10-24 NOTE — Progress Notes (Signed)
  [facility] Adult Case Management Discharge Plan :  Will you be returning to the same living situation after discharge:  {B[facility] CM:22252} At discharge, do you have transportation home?: {[facility] CM:22252} Do you have the ability to pay for your medications: {BHH CM:22252}  Release of information consent forms completed and in the chart;  Patient's signature needed at discharge.  Patient to Follow up at:  Follow-up Information     [facility]. Go on 11/01/2022.   Why: 2p with [facility].  Please arrive 15 minutes early. At the end of your appointment your therapist will make an appt for psychiatry Contact information: [address] Laurell Josephs [address] [place] [address] [phone]                Next level of care provider has access to [facility] Link:{REFRESHABLE YES [id number]}  Safety Planning and Suicide Prevention discussed: [facility] CM:22252}     Has patient been referred to the Quitline?: {BHH [id number]}  Patient has been referred for addiction treatment: {BHH CM Addiction referral:21161} Patient to continue working towards treatment goals after discharge. Patient no longer meets criteria for inpatient criteria per attending physician. Continue taking medications as prescribed, nursing to provide instructions at discharge. Follow up with all scheduled appointments.   Traves Majchrzak S Bernabe Dorce, LCSW 10/24/2022, 3:47 PM

## 2022-10-24 NOTE — Group Note (Signed)
Date:  10/24/2022 Time:  4:40 PM  Group Topic/Focus:  Goals Group:   The focus of this group is to help patients establish daily goals to achieve during treatment and discuss how the patient can incorporate goal setting into their daily lives to aide in recovery. Orientation:   The focus of this group is to educate the patient on the purpose and policies of crisis stabilization and provide a format to answer questions about their admission.  The group details unit policies and expectations of patients while admitted.    Participation Level:  Active  Participation Quality:  Appropriate  Affect:  Appropriate  Cognitive:  Appropriate  Insight: Appropriate  Engagement in Group:  Engaged  Modes of Intervention:  Discussion and Orientation  Additional Comments:   Pt attended and actively participated in the Orientation/Goals group.  Emma Cruz Emma Cruz Emma Cruz 10/24/2022, 4:40 PM

## 2022-10-24 NOTE — Progress Notes (Signed)
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 She was assessed and recommended for inpatient treatment, transferred to [facility].    24-hour chart Review: Past 24 hours of patient's chart was reviewed.  Patient is compliant with scheduled meds. Required Agitation PRNs: none Per RN notes, no documented behavioral issues and is attending group. Patient slept, 8 hours  Assessment: Emma Cruz is seen and examined in the office sitting up in a chair.  She is alert and oriented to person, place, time & situation.  Chart reviewed and findings shared with the treatment team and consult with attending psychiatrist.  She is visible on the unit and observed in the day room associating with peers. She is casually dressed, calm and cooperative. Mood and affect brighter with smiles.  She rates depression as #3/10, with 10 being of highest severity.  Continues on Abilify 2 mg p.o. daily to augment her antidepressants of Lexapro 20 mg p.o. daily.  Reports anxiety as 2/10, with 10 being of highest severity.  Encouraged patient to the nursing staff for as needed for anxiety.  Reports taking her medication as scheduled without any somatic discomfort.  Patient expresses appreciation to all staff for the care they provided for her. She endorses good sleep quality and sleeping up to 8 hours last night.   She denies SI/HI/AVH and no signs of paranoia or delusional thought.  She is able to contracts for safety.  Vital signs reviewed within normal limits.  Will continue with current treatment plan.  Expected  date of discharge 10/25/2022.  Patient depression, mood, and anxiety is stabilized.  She is able to state her goals and has good futuristic plans.  Collateral: Kelle Darting (significant other) [phone]  prefers not to contact  Edison Pace (best friend) [phone] no answer today 10/23/2022  Principal Problem: MDD (major depressive disorder), recurrent severe, without psychosis (HCC) Diagnosis: Principal Problem:   MDD (major depressive disorder), recurrent severe, without psychosis (HCC)  Total Time spent with patient: 30 minutes  Past Psychiatric History: anxiety, depression  Past Medical History:  Past Medical History:  Diagnosis Date   Anxiety    Depression     Past Surgical History:  Procedure Laterality Date   CHOLECYSTECTOMY     SCAR REVISION     on left jaw   TONSILLECTOMY     Family History:  Family History  Problem Relation Age of Onset   Diabetes Mother    Cancer Father        thyroid   Family Psychiatric  History: see H&P Social History:  Social History   Substance and Sexual Activity  Alcohol Use Not Currently     Social History   Substance and Sexual Activity  Drug Use Never    Social History   Socioeconomic History   Marital status: Single    Spouse name: Not on file   Number of children: Not on file   Years of education: Not on file   Highest education level: Not on file  Occupational History   Not on file  Tobacco Use   Smoking status: Never  Smokeless tobacco: Never  Vaping Use   Vaping Use: Never used  Substance and Sexual Activity   Alcohol use: Not Currently   Drug use: Never   Sexual activity: Yes  Other Topics Concern   Not on file  Social History Narrative   Not on file   Social Determinants of Health   Financial Resource Strain: Not on file  Food Insecurity: No Food Insecurity (10/19/2022)   Hunger Vital Sign    Worried About Running Out of Food in the Last Year: Never true    Ran Out of Food in the Last Year:  Never true  Transportation Needs: No Transportation Needs (10/19/2022)   PRAPARE - Administrator, Civil Service (Medical): No    Lack of Transportation (Non-Medical): No  Physical Activity: Not on file  Stress: Not on file  Social Connections: Not on file   Additional Social History:   Sleep: Fair  Appetite:  Fair  Current Medications: Current Facility-Administered Medications  Medication Dose Route Frequency Provider Last Rate Last Admin   acetaminophen (TYLENOL) tablet 650 mg  650 mg Oral Q6H PRN Lenard Lance, FNP       alum & mag hydroxide-simeth (MAALOX/MYLANTA) 200-200-20 MG/5ML suspension 30 mL  30 mL Oral Q4H PRN Lenard Lance, FNP       ARIPiprazole (ABILIFY) tablet 2 mg  2 mg Oral Daily Kani Jobson, Jesusita Oka, FNP   2 mg at 10/24/22 6644   diphenhydrAMINE (BENADRYL) capsule 50 mg  50 mg Oral TID PRN Lenard Lance, FNP       Or   diphenhydrAMINE (BENADRYL) injection 50 mg  50 mg Intramuscular TID PRN Lenard Lance, FNP       escitalopram (LEXAPRO) tablet 20 mg  20 mg Oral Daily Leevy-Johnson, Brooke A, NP   20 mg at 10/24/22 0347   haloperidol (HALDOL) tablet 5 mg  5 mg Oral TID PRN Lenard Lance, FNP       Or   haloperidol lactate (HALDOL) injection 5 mg  5 mg Intramuscular TID PRN Lenard Lance, FNP       hydrOXYzine (ATARAX) tablet 25 mg  25 mg Oral TID PRN Lenard Lance, FNP   25 mg at 10/20/22 0850   LORazepam (ATIVAN) tablet 2 mg  2 mg Oral TID PRN Lenard Lance, FNP       Or   LORazepam (ATIVAN) injection 2 mg  2 mg Intramuscular TID PRN Lenard Lance, FNP       magnesium hydroxide (MILK OF MAGNESIA) suspension 30 mL  30 mL Oral Daily PRN Lenard Lance, FNP       traZODone (DESYREL) tablet 50 mg  50 mg Oral QHS PRN Lenard Lance, FNP        Lab Results:  No results found for this or any previous visit (from the past 48 hour(s)).   Blood Alcohol level:  No results found for: "ETH"  Metabolic Disorder Labs: Lab Results  Component Value Date   HGBA1C 4.7  (L) 10/21/2022   MPG 88.19 10/21/2022   No results found for: "PROLACTIN" Lab Results  Component Value Date   CHOL 119 10/21/2022   TRIG 76 10/21/2022   HDL 40 (L) 10/21/2022   CHOLHDL 3.0 10/21/2022   VLDL 15 10/21/2022   LDLCALC 64 10/21/2022    Physical Findings: AIMS:  , ,  ,  ,    CIWA:    COWS:  Musculoskeletal: Strength & Muscle Tone: within normal limits Gait & Station: normal Patient leans: N/A  Psychiatric Specialty Exam:  Presentation  General Appearance:  Appropriate for Environment; Casual; Fairly Groomed  Eye Contact: Good  Speech: Clear and Coherent  Speech Volume: Normal  Handedness: Right  Mood and Affect  Mood: -- (Improving)  Affect: Congruent; Appropriate  Thought Process  Thought Processes: Goal Directed; Coherent  Descriptions of Associations:Intact  Orientation:Full (Time, Place and Person)  Thought Content:Logical  History of Schizophrenia/Schizoaffective disorder:No data recorded Duration of Psychotic Symptoms:No data recorded Hallucinations:Hallucinations: None  Ideas of Reference:None  Suicidal Thoughts:Suicidal Thoughts: No  Homicidal Thoughts:Homicidal Thoughts: No   Sensorium  Memory: Immediate Good; Recent Good  Judgment: Good  Insight: Good  Executive Functions  Concentration: Good  Attention Span: Good  Recall: Fair  Fund of Knowledge: Fair  Language: Good  Psychomotor Activity  Psychomotor Activity: Psychomotor Activity: Normal  Assets  Assets: Communication Skills; Desire for Improvement; Physical Health; Social Support  Sleep  Sleep: Sleep: Good Number of Hours of Sleep: 9  Physical Exam: Physical Exam Vitals and nursing note reviewed.  Constitutional:      Appearance: She is normal weight.  HENT:     Head: Normocephalic.     Nose: Nose normal.     Mouth/Throat:     Mouth: Mucous membranes are moist.  Eyes:     Pupils: Pupils are equal, round, and reactive to  light.  Cardiovascular:     Rate and Rhythm: Normal rate.     Pulses: Normal pulses.  Pulmonary:     Effort: Pulmonary effort is normal.  Genitourinary:    Comments: Deferred Musculoskeletal:        General: Normal range of motion.     Cervical back: Normal range of motion.  Skin:    General: Skin is warm and dry.  Neurological:     General: No focal deficit present.     Mental Status: She is alert and oriented to person, place, and time.  Psychiatric:        Attention and Perception: Attention and perception normal.        Mood and Affect: Mood is depressed. Affect is tearful.        Speech: Speech normal.        Behavior: Behavior normal. Behavior is cooperative.        Thought Content: Thought content normal. Thought content is not paranoid or delusional. Thought content does not include homicidal or suicidal ideation. Thought content does not include homicidal or suicidal plan.        Cognition and Memory: Cognition and memory normal.     Comments: Depression and sadness    Review of Systems  Constitutional:  Negative for fever.  HENT:  Negative for sore throat.   Eyes: Negative.   Respiratory:  Negative for shortness of breath.   Cardiovascular: Negative.   Gastrointestinal:  Negative for nausea and vomiting.  Genitourinary:  Negative for dysuria.  Musculoskeletal: Negative.   Skin: Negative.   Neurological: Negative.   Endo/Heme/Allergies: Negative.   Psychiatric/Behavioral:  Positive for depression. The patient is nervous/anxious.    Blood pressure 108/74, pulse 88, temperature 98.4 F (36.9 C), temperature source Oral, resp. rate 16, height 5\' 5"  (1.651 m), weight 65 kg, SpO2 99 %, unknown if currently breastfeeding. Body mass index is 23.83 kg/m.  Treatment Plan Summary: Daily contact with patient to assess and evaluate symptoms and progress in treatment, Medication management, and Plan   PLAN:  Medications:  Continue:  -Escitalopram 20 mg daily for  depressive symptoms; from 10 mg daily -Abilify 2 mg p.o. daily to augment antidepressant  Agitation Orders: Diphenhydramine 50 mg IM/PO, Haldol 5 mg IM/PO, Lorazepam 2mg  IM/PO TID PRN agitation   PRNs:  Acetaminophen 650 mg every 6 as needed/mild pain Hydroxyzine 25 mg PO TID PRN anxiety Maalox 30 mL oral every 4 as needed/digestion Magnesium hydroxide 30 mL daily as needed/mild constipation Trazodone 50 mg p.o. daily at bedtime PRN for insomnia    Physician Treatment Plan for Primary Diagnosis: MDD (major depressive disorder), recurrent severe, without psychosis (HCC) Long Term Goal(s): Improvement in symptoms so as ready for discharge   Short Term Goals: Ability to identify changes in lifestyle to reduce recurrence of condition will improve, Ability to verbalize feelings will improve, Ability to disclose and discuss suicidal ideas, Ability to demonstrate self-control will improve, Ability to identify and develop effective coping behaviors will improve, Ability to maintain clinical measurements within normal limits will improve, Compliance with prescribed medications will improve, and Ability to identify triggers associated with substance abuse/mental health issues will improve   Physician Treatment Plan for Secondary Diagnosis: Principal Problem:   MDD (major depressive disorder), recurrent severe, without psychosis (HCC)   Long Term Goal(s): Improvement in symptoms so as ready for discharge   Short Term Goals: Ability to identify changes in lifestyle to reduce recurrence of condition will improve, Ability to verbalize feelings will improve, Ability to disclose and discuss suicidal ideas, Ability to demonstrate self-control will improve, Ability to identify and develop effective coping behaviors will improve, Ability to maintain clinical measurements within normal limits will improve, Compliance with prescribed medications will improve, and Ability to identify triggers associated with  substance abuse/mental health issues will improve  Cecilie Lowers, FNP 10/24/2022, 10:46 AMPatient ID: Pincus Badder, female   DOB: [birth year], 33 y.o.   MRN: [id number] Patient ID: Emma Cruz, female   DOB: September 16, 1990, 32 y.o.   MRN: [id number] Patient ID: Emma Cruz, female   DOB: [birth year], 32 y.o.   MRN: [id number]

## 2022-10-25 ENCOUNTER — Encounter (HOSPITAL_COMMUNITY): Payer: Self-pay

## 2022-10-25 MED ORDER — HYDROXYZINE HCL 25 MG PO TABS: 25.0000 mg | ORAL_TABLET | Freq: Three times a day (TID) | ORAL | 0 refills | Status: AC | PRN

## 2022-10-25 MED ORDER — ESCITALOPRAM OXALATE 20 MG PO TABS: 20.0000 mg | ORAL_TABLET | Freq: Every day | ORAL | 0 refills | Status: AC

## 2022-10-25 MED ORDER — ARIPIPRAZOLE 2 MG PO TABS: 2.0000 mg | ORAL_TABLET | Freq: Every day | ORAL | 0 refills | Status: AC

## 2022-10-25 NOTE — Progress Notes (Signed)
D: Pt A & O X 4. Denies SI, HI, AVH and pain at this time. D/C home as ordered. Picked up in front of facility by her friend. A: D/C instructions reviewed with pt including electronic prescriptions and follow up appointments; compliance encouraged. Pt had no belongings in locker at time of departure. Scheduled medications given with verbal education and effects monitored. Safety checks maintained without incident till time of d/c.  R: Pt receptive to care. Compliant with medications when offered. Denies adverse drug reactions when assessed. Verbalized understanding related to d/c instructions. Signed belonging sheet in agreement with no items in locker at time of d/c. Ambulatory with a steady gait. Appears to be in no physical distress at time of departure.

## 2022-10-25 NOTE — [facility] Group Notes (Signed)
[facility] Group Notes:  (Nursing/MHT/Case Management/Adjunct)  Date:  10/25/2022  Time:  2:47 AM  Type of Therapy:   Wrap-up group  Participation Level:  Active  Participation Quality:  Appropriate  Affect:  Appropriate  Cognitive:  Appropriate  Insight:  Appropriate  Engagement in Group:  Engaged  Modes of Intervention:  Education  Summary of Progress/Problems: Pt goal to go home tomorrow. Day was 10/10.  Noah Delaine 10/25/2022, 2:47 AM

## 2022-10-25 NOTE — [facility] Suicide Risk Assessment (Signed)
Suicide Risk Assessment  Discharge Assessment    [facility] Discharge Suicide Risk Assessment   Principal Problem: MDD (major depressive disorder), recurrent severe, without psychosis (HCC) Discharge Diagnoses: Principal Problem:   MDD (major depressive disorder), recurrent severe, without psychosis (HCC)  Total Time spent with patient: 30 minutes  Musculoskeletal: Strength & Muscle Tone: within normal limits Gait & Station: normal Patient leans: N/A  Psychiatric Specialty Exam  Presentation  General Appearance:  Appropriate for Environment; Casual; Fairly Groomed  Eye Contact: Good  Speech: Clear and Coherent  Speech Volume: Normal  Handedness: Right  Mood and Affect  Mood: -- (Improving)  Duration of Depression Symptoms: No data recorded Affect: Congruent; Appropriate  Thought Process  Thought Processes: Goal Directed; Coherent  Descriptions of Associations:Intact  Orientation:Full (Time, Place and Person)  Thought Content:Logical  History of Schizophrenia/Schizoaffective disorder:No data recorded Duration of Psychotic Symptoms:No data recorded Hallucinations:Hallucinations: None  Ideas of Reference:None  Suicidal Thoughts:Suicidal Thoughts: No  Homicidal Thoughts:Homicidal Thoughts: No  Sensorium  Memory: Immediate Good; Recent Good  Judgment: Good  Insight: Good   Executive Functions  Concentration: Good  Attention Span: Good  Recall: Fair  Fund of Knowledge: Fair  Language: Good  Psychomotor Activity  Psychomotor Activity: Psychomotor Activity: Normal   Assets  Assets: Communication Skills; Desire for Improvement; Physical Health; Social Support  Sleep  Sleep: Sleep: Good Number of Hours of Sleep: 9  Physical Exam: Physical Exam Vitals and nursing note reviewed.  Constitutional:      Appearance: Normal appearance.  HENT:     Head: Normocephalic.     Nose: Nose normal.     Mouth/Throat:     Mouth: Mucous  membranes are moist.     Pharynx: Oropharynx is clear.  Eyes:     Conjunctiva/sclera: Conjunctivae normal.     Pupils: Pupils are equal, round, and reactive to light.  Cardiovascular:     Rate and Rhythm: Normal rate.     Pulses: Normal pulses.  Pulmonary:     Effort: Pulmonary effort is normal.  Abdominal:     Comments: deferred  Genitourinary:    Comments: deferred Musculoskeletal:        General: Normal range of motion.     Cervical back: Normal range of motion.  Skin:    General: Skin is warm.  Neurological:     General: No focal deficit present.     Mental Status: She is alert and oriented to person, place, and time.  Psychiatric:        Mood and Affect: Mood normal.        Behavior: Behavior normal.        Thought Content: Thought content normal.        Judgment: Judgment normal.    Review of Systems  Constitutional:  Negative for fever.  HENT: Negative.    Eyes: Negative.   Respiratory:  Negative for shortness of breath.   Cardiovascular: Negative.   Gastrointestinal:  Negative for nausea and vomiting.  Genitourinary: Negative.   Musculoskeletal: Negative.   Neurological: Negative.   Endo/Heme/Allergies: Negative.        ` See allergy listing  Psychiatric/Behavioral:  Positive for depression.    Blood pressure 105/77, pulse 87, temperature 97.9 F (36.6 C), temperature source Oral, resp. rate 16, height 5\' 5"  (1.651 m), weight 65 kg, SpO2 100 %, unknown if currently breastfeeding. Body mass index is 23.83 kg/m.  Mental Status Per Nursing Assessment::   On Admission:  NA  Demographic Factors:  Low socioeconomic  status, Living alone, and Unemployed  Loss Factors: Loss of significant relationship and Financial problems/change in socioeconomic status  Historical Factors: Family history of mental illness or substance abuse and Impulsivity  Risk Reduction Factors:   Responsible for children under 61 years of age, Sense of responsibility to family,  Employed, and Positive coping skills or problem solving skills  Continued Clinical Symptoms:  Depression:   Impulsivity Recent sense of peace/wellbeing More than one psychiatric diagnosis Unstable or Poor Therapeutic Relationship Previous Psychiatric Diagnoses and Treatments  Cognitive Features That Contribute To Risk:  Polarized thinking    Suicide Risk:  Mild:  Suicidal ideation of limited frequency, intensity, duration, and specificity.  There are no identifiable plans, no associated intent, mild dysphoria and related symptoms, good self-control (both objective and subjective assessment), few other risk factors, and identifiable protective factors, including available and accessible social support.   Follow-up Information     [facility]. Go on 11/01/2022.   Why: 2p with [facility].  Please arrive 15 minutes early. At the end of your appointment your therapist will make an appt for psychiatry Contact information: [address], [place] [address] [phone]                Plan Of Care/Follow-up recommendations:  Discharge Recommendations:  The patient is being discharged with her family. Patient is to take her discharge medications as ordered.  See follow up above. We recommend that she participates in individual therapy to target uncontrollable agitation and substance abuse.  We recommend that she participates in family therapy to target the conflict with her family, to improve communication skills and conflict resolution skills.  Family is to initiate/implement a contingency based behavioral model to address patient's behavior. We recommend that she get AIMS scale, height, weight, blood pressure, fasting lipid panel, fasting blood sugar in three months from discharge if she's on atypical antipsychotics.  Patient will benefit from monitoring of recurrent suicidal ideation since patient is on antidepressant medication. The  patient should abstain from all illicit substances and alcohol. If the patient's symptoms worsen or do not continue to improve or if the patient becomes actively suicidal or homicidal then it is recommended that the patient return to the closest hospital emergency room or call 911 for further evaluation and treatment. National Suicide Prevention Lifeline 1800-SUICIDE or [phone]. Please follow up with your primary medical doctor for all other medical needs.  The patient has been educated on the possible side effects to medications and she/her guardian is to contact a medical professional and inform outpatient provider of any new side effects of medication. She is to take regular diet and activity as tolerated.  Will benefit from moderate daily exercise. Family was educated about removing/locking any firearms, medications or dangerous products from the home.  Activity:  As tolerated Diet:  Regular Diet   Cecilie Lowers, FNP 10/25/2022, 9:36 AM

## 2022-10-25 NOTE — BH IP Treatment Plan (Unsigned)
Interdisciplinary Treatment and Diagnostic Plan Update  10/25/2022 Time of Session: 9:45 AM ( UPDATE)  Emma Cruz MRN: [id number]  Principal Diagnosis: MDD (major depressive disorder), recurrent severe, without psychosis (HCC)  Secondary Diagnoses: Principal Problem:   MDD (major depressive disorder), recurrent severe, without psychosis (HCC)   Current Medications:  Current Facility-Administered Medications  Medication Dose Route Frequency Provider Last Rate Last Admin   acetaminophen (TYLENOL) tablet 650 mg  650 mg Oral Q6H PRN Lenard Lance, FNP       alum & mag hydroxide-simeth (MAALOX/MYLANTA) 200-200-20 MG/5ML suspension 30 mL  30 mL Oral Q4H PRN Lenard Lance, FNP       ARIPiprazole (ABILIFY) tablet 2 mg  2 mg Oral Daily Ntuen, Jesusita Oka, FNP   2 mg at 10/25/22 0750   diphenhydrAMINE (BENADRYL) capsule 50 mg  50 mg Oral TID PRN Lenard Lance, FNP       Or   diphenhydrAMINE (BENADRYL) injection 50 mg  50 mg Intramuscular TID PRN Lenard Lance, FNP       escitalopram (LEXAPRO) tablet 20 mg  20 mg Oral Daily Ntuen, Tina C, FNP   20 mg at 10/25/22 0750   haloperidol (HALDOL) tablet 5 mg  5 mg Oral TID PRN Lenard Lance, FNP       Or   haloperidol lactate (HALDOL) injection 5 mg  5 mg Intramuscular TID PRN Lenard Lance, FNP       hydrOXYzine (ATARAX) tablet 25 mg  25 mg Oral TID PRN Lenard Lance, FNP   25 mg at 10/20/22 0850   LORazepam (ATIVAN) tablet 2 mg  2 mg Oral TID PRN Lenard Lance, FNP       Or   LORazepam (ATIVAN) injection 2 mg  2 mg Intramuscular TID PRN Lenard Lance, FNP       magnesium hydroxide (MILK OF MAGNESIA) suspension 30 mL  30 mL Oral Daily PRN Lenard Lance, FNP       traZODone (DESYREL) tablet 50 mg  50 mg Oral QHS PRN Lenard Lance, FNP   50 mg at 10/24/22 2134   PTA Medications: No medications prior to admission.    Patient Stressors: Financial difficulties   Health problems   Substance abuse    Patient Strengths: Average or above average  intelligence  Communication skills  Supportive family/friends   Treatment Modalities: Medication Management, Group therapy, Case management,  1 to 1 session with clinician, Psychoeducation, Recreational therapy.   Physician Treatment Plan for Primary Diagnosis: MDD (major depressive disorder), recurrent severe, without psychosis (HCC) Long Term Goal(s): Improvement in symptoms so as ready for discharge   Short Term Goals: Ability to identify changes in lifestyle to reduce recurrence of condition will improve Ability to verbalize feelings will improve Ability to disclose and discuss suicidal ideas Ability to demonstrate self-control will improve Ability to identify and develop effective coping behaviors will improve Ability to maintain clinical measurements within normal limits will improve Compliance with prescribed medications will improve Ability to identify triggers associated with substance abuse/mental health issues will improve  Medication Management: Evaluate patient's response, side effects, and tolerance of medication regimen.  Therapeutic Interventions: 1 to 1 sessions, Unit Group sessions and Medication administration.  Evaluation of Outcomes: Adequate for Discharge  Physician Treatment Plan for Secondary Diagnosis: Principal Problem:   MDD (major depressive disorder), recurrent severe, without psychosis (HCC)  Long Term Goal(s): Improvement in symptoms so as ready for discharge   Short  Term Goals: Ability to identify changes in lifestyle to reduce recurrence of condition will improve Ability to verbalize feelings will improve Ability to disclose and discuss suicidal ideas Ability to demonstrate self-control will improve Ability to identify and develop effective coping behaviors will improve Ability to maintain clinical measurements within normal limits will improve Compliance with prescribed medications will improve Ability to identify triggers associated with  substance abuse/mental health issues will improve     Medication Management: Evaluate patient's response, side effects, and tolerance of medication regimen.  Therapeutic Interventions: 1 to 1 sessions, Unit Group sessions and Medication administration.  Evaluation of Outcomes: Adequate for Discharge   RN Treatment Plan for Primary Diagnosis: MDD (major depressive disorder), recurrent severe, without psychosis (HCC) Long Term Goal(s): Knowledge of disease and therapeutic regimen to maintain health will improve  Short Term Goals: Ability to remain free from injury will improve, Ability to verbalize frustration and anger appropriately will improve, Ability to participate in decision making will improve, Ability to verbalize feelings will improve, Ability to identify and develop effective coping behaviors will improve, and Compliance with prescribed medications will improve  Medication Management: RN will administer medications as ordered by provider, will assess and evaluate patient's response and provide education to patient for prescribed medication. RN will report any adverse and/or side effects to prescribing provider.  Therapeutic Interventions: 1 on 1 counseling sessions, Psychoeducation, Medication administration, Evaluate responses to treatment, Monitor vital signs and CBGs as ordered, Perform/monitor CIWA, COWS, AIMS and Fall Risk screenings as ordered, Perform wound care treatments as ordered.  Evaluation of Outcomes: Adequate for Discharge   LCSW Treatment Plan for Primary Diagnosis: MDD (major depressive disorder), recurrent severe, without psychosis (HCC) Long Term Goal(s): Safe transition to appropriate next level of care at discharge, Engage patient in therapeutic group addressing interpersonal concerns.  Short Term Goals: Engage patient in aftercare planning with referrals and resources, Increase social support, Increase emotional regulation, Facilitate acceptance of mental  health diagnosis and concerns, Identify triggers associated with mental health/substance abuse issues, and Increase skills for wellness and recovery  Therapeutic Interventions: Assess for all discharge needs, 1 to 1 time with Social worker, Explore available resources and support systems, Assess for adequacy in community support network, Educate family and significant other(s) on suicide prevention, Complete Psychosocial Assessment, Interpersonal group therapy.  Evaluation of Outcomes: Adequate for Discharge   Progress in Treatment: Attending groups: {BHH ADULT:22608} Participating in groups: {BHH ADULT:22608} Taking medication as prescribed: {BHH ADULT:22608} Toleration medication: {BHH ADULT:22608} Family/Significant other contact made: {YES/NO/CONTACT:22665} Patient understands diagnosis: {BHH ZOXWR:60454} Discussing patient identified problems/goals with staff: {BHH UJWJX:91478} Medical problems stabilized or resolved: {BHH ADULT:22608} Denies suicidal/homicidal ideation: {BHH ADULT:22608} Issues/concerns per patient self-inventory: {BHH ADULT:22608} Other: ***  New problem(s) identified: {BHH NEW PROBLEMS:22609}  New Short Term/Long Term Goal(s):  Patient Goals:    Discharge Plan or Barriers:   Reason for Continuation of Hospitalization: {BHH Reasons for continued hospitalization:22604}  Estimated Length of Stay:  Last 3 [place] Suicide Severity Risk Score: Flowsheet Row Ad[facility] (Current) [facility] from 09/28/2021 in [facility] Admission (Discharged) from 08/06/2021 in [facility] Baby Unit  C-SSRS RISK CATEGORY No Risk No Risk No Risk       Last PHQ 2/9 Scores:     No data to display          Scribe for Treatment Team: Beather Arbour 10/25/2022 8:45 AM

## 2022-10-25 NOTE — Progress Notes (Signed)
   10/24/22 2300  Psych Admission Type (Psych Patients Only)  Admission Status Voluntary  Psychosocial Assessment  Patient Complaints None  Eye Contact Fair  Facial Expression  (Bright and happpy)  Affect Appropriate to circumstance  Speech Logical/coherent  Interaction Assertive  Motor Activity  (WDL)  Appearance/Hygiene Unremarkable  Behavior Characteristics Cooperative;Appropriate to situation  Mood Pleasant  Thought Process  Coherency WDL  Content WDL  Delusions None reported or observed  Perception WDL  Hallucination None reported or observed  Judgment Poor  Confusion None  Danger to Self  Current suicidal ideation? Denies  Danger to Others  Danger to Others None reported or observed   D: Pt alert and oriented.   Pt denies experiencing any SI/HI, or AVH at this time.   A: PRN Trazodone was administered. Support and encouragement provided. Frequent verbal contact made. Routine safety checks conducted q15 minutes.   R: No adverse drug reactions noted. Pt verbally contracts for safety at this time. Pt complaint with medications and treatment plan. Pt interacts well with others on the unit. Pt remains safe at this time. Will continue to monitor.

## 2022-10-25 NOTE — Discharge Summary (Signed)
Physician Discharge Summary Note  Patient:  Emma Cruz is an 32 y.o., female MRN:  [id number] DOB:  1991-02-28 Patient phone:  [phone] (home)  Patient address:   [address] [place] [address],  Total Time spent with patient: 30 minutes  Date of Admission:  10/19/2022 Date of Discharge:   10/25/2022  Reason for Admission:  [name] is a 33 year old female patient with a past psychiatric history of Major Depressive Disorder recurrent severe without psychosis who initially presented to [facility] with suicidal ideations following an intentional overdose of Xanax. She was assessed and recommended for inpatient treatment, transferred to [facility].    Principal Problem: MDD (major depressive disorder), recurrent severe, without psychosis (HCC) Discharge Diagnoses: Principal Problem:   MDD (major depressive disorder), recurrent severe, without psychosis (HCC)  Past Psychiatric History: anxiety, depression   Past Medical History:  Past Medical History:  Diagnosis Date   Anxiety    Depression     Past Surgical History:  Procedure Laterality Date   CHOLECYSTECTOMY     SCAR REVISION     on left jaw   TONSILLECTOMY     Family History:  Family History  Problem Relation Age of Onset   Diabetes Mother    Cancer Father        thyroid   Family Psychiatric  History: mother- bipolar  Social History:  Social History   Substance and Sexual Activity  Alcohol Use Not Currently     Social History   Substance and Sexual Activity  Drug Use Never    Social History   Socioeconomic History   Marital status: Single    Spouse name: Not on file   Number of children: Not on file   Years of education: Not on file   Highest education level: Not on file  Occupational History   Not on file  Tobacco Use   Smoking status: Never   Smokeless tobacco: Never  Vaping Use   Vaping Use: Never used  Substance and Sexual Activity   Alcohol use: Not Currently   Drug  use: Never   Sexual activity: Yes  Other Topics Concern   Not on file  Social History Narrative   Not on file   Social Determinants of Health   Financial Resource Strain: Not on file  Food Insecurity: No Food Insecurity (10/19/2022)   Hunger Vital Sign    Worried About Running Out of Food in the Last Year: Never true    Ran Out of Food in the Last Year: Never true  Transportation Needs: No Transportation Needs (10/19/2022)   PRAPARE - Administrator, Civil Service (Medical): No    Lack of Transportation (Non-Medical): No  Physical Activity: Not on file  Stress: Not on file  Social Connections: Not on file    Hospital Course:  During the patient's hospitalization, patient had extensive initial psychiatric evaluation, and follow-up psychiatric evaluations every day.  Psychiatric diagnoses provided upon initial assessment:  Major depressive disorder, recurrent severe, without psychosis  Patient's psychiatric medications were adjusted on admission:  Escitalopram 10 mg p.o. daily for depressive symptoms  During the hospitalization, other adjustments were made to the patient's psychiatric medication regimen:  Escitalopram tablet 20 mg p.o. daily for depressive symptoms Abilify tablet 2 mg p.o. daily to augment antidepressant  Patient's care was discussed during the interdisciplinary team meeting every day during the hospitalization.  The patient denies having side effects to prescribed psychiatric medication.  Gradually,  patient started adjusting to milieu. The patient was evaluated each day by a clinical provider to ascertain response to treatment. Improvement was noted by the patient's report of decreasing symptoms, improved sleep and appetite, affect, medication tolerance, behavior, and participation in unit programming.  Patient was asked each day to complete a self inventory noting mood, mental status, pain, new symptoms, anxiety and concerns.    Symptoms were  reported as significantly decreased or resolved completely by discharge.   On day of discharge, the patient reports that their mood is stable. The patient denied having suicidal thoughts for more than 48 hours prior to discharge.  Patient denies having homicidal thoughts.  Patient denies having auditory hallucinations.  Patient denies any visual hallucinations or other symptoms of psychosis. The patient was motivated to continue taking medication with a goal of continued improvement in mental health.   The patient reports their target psychiatric symptoms of depression responded well to the psychiatric medications, and the patient reports overall benefit other psychiatric hospitalization. Supportive psychotherapy was provided to the patient. The patient also participated in regular group therapy while hospitalized. Coping skills, problem solving as well as relaxation therapies were also part of the unit programming.  Labs were reviewed with the patient, and abnormal results were discussed with the patient.  The patient is able to verbalize their individual safety plan to this provider.  # It is recommended to the patient to continue psychiatric medications as prescribed, after discharge from the hospital.    # It is recommended to the patient to follow up with your outpatient psychiatric provider and PCP.  # It was discussed with the patient, the impact of alcohol, drugs, tobacco have been there overall psychiatric and medical wellbeing, and total abstinence from substance use was recommended the patient.ed.  # Prescriptions provided or sent directly to preferred pharmacy at discharge. Patient agreeable to plan. Given opportunity to ask questions. Appears to feel comfortable with discharge.    # In the event of worsening symptoms, the patient is instructed to call the crisis hotline, 911 and or go to the nearest ED for appropriate evaluation and treatment of symptoms. To follow-up with primary care  provider for other medical issues, concerns and or health care needs  # Patient was discharged home with a plan to follow up as noted below.  Physical Findings: AIMS: Facial and Oral Movements Muscles of Facial Expression: None, normal Lips and Perioral Area: None, normal Jaw: None, normal Tongue: None, normal,Extremity Movements Upper (arms, wrists, hands, fingers): None, normal Lower (legs, knees, ankles, toes): None, normal, Trunk Movements Neck, shoulders, hips: None, normal, Overall Severity Severity of abnormal movements (highest score from questions above): None, normal Incapacitation due to abnormal movements: None, normal Patient's awareness of abnormal movements (rate only patient's report): No Awareness, Dental Status Current problems with teeth and/or dentures?: No Does patient usually wear dentures?: No  CIWA:    COWS:     Musculoskeletal: Strength & Muscle Tone: within normal limits Gait & Station: normal Patient leans: N/A  Psychiatric Specialty Exam:  Presentation  General Appearance:  Appropriate for Environment; Casual; Fairly Groomed  Eye Contact: Good  Speech: Clear and Coherent  Speech Volume: Normal  Handedness: Right  Mood and Affect  Mood: -- (Improving)  Affect: Congruent; Appropriate  Thought Process  Thought Processes: Goal Directed; Coherent  Descriptions of Associations:Intact  Orientation:Full (Time, Place and Person)  Thought Content:Logical  History of Schizophrenia/Schizoaffective disorder:No data recorded Duration of Psychotic Symptoms:No data recorded Hallucinations:Hallucinations: None  Ideas of Reference:None  Suicidal Thoughts:Suicidal Thoughts: No  Homicidal Thoughts:Homicidal Thoughts: No  Sensorium  Memory: Immediate Good; Recent Good  Judgment: Good  Insight: Good  Executive Functions  Concentration: Good  Attention Span: Good  Recall: Fair  Fund of  Knowledge: Fair  Language: Good  Psychomotor Activity  Psychomotor Activity: Psychomotor Activity: Normal  Assets  Assets: Communication Skills; Desire for Improvement; Physical Health; Social Support  Sleep  Sleep: Sleep: Good Number of Hours of Sleep: 9  Physical Exam: Physical Exam Vitals and nursing note reviewed.  Constitutional:      Appearance: Normal appearance.  HENT:     Head: Normocephalic.     Nose: Nose normal.     Mouth/Throat:     Mouth: Mucous membranes are moist.     Pharynx: Oropharynx is clear.  Eyes:     Pupils: Pupils are equal, round, and reactive to light.  Cardiovascular:     Rate and Rhythm: Normal rate.     Pulses: Normal pulses.  Pulmonary:     Effort: Pulmonary effort is normal.  Abdominal:     Comments: deferred  Genitourinary:    Comments: deferred Musculoskeletal:        General: Normal range of motion.     Cervical back: Normal range of motion.  Skin:    General: Skin is warm.  Neurological:     General: No focal deficit present.     Mental Status: She is alert and oriented to person, place, and time.  Psychiatric:        Mood and Affect: Mood normal.        Behavior: Behavior normal.        Thought Content: Thought content normal.        Judgment: Judgment normal.    Review of Systems  Constitutional:  Negative for fever.  HENT: Negative.    Eyes: Negative.   Respiratory:  Negative for shortness of breath.   Cardiovascular: Negative.   Gastrointestinal:  Negative for nausea and vomiting.  Genitourinary: Negative.   Musculoskeletal: Negative.   Skin: Negative.   Neurological: Negative.   Endo/Heme/Allergies: Negative.        See allergy listing  Psychiatric/Behavioral:  Positive for depression (Stable with medication). The patient is nervous/anxious (Improved with medication) and has insomnia (Improved with medication).    Blood pressure 105/77, pulse 87, temperature 97.9 F (36.6 C), temperature source Oral,  resp. rate 16, height 5\' 5"  (1.651 m), weight 65 kg, SpO2 100 %, unknown if currently breastfeeding. Body mass index is 23.83 kg/m.   Social History   Tobacco Use  Smoking Status Never  Smokeless Tobacco Never   Tobacco Cessation:  N/A, patient does not currently use tobacco products   Blood Alcohol level:  No results found for: "ETH"  Metabolic Disorder Labs:  Lab Results  Component Value Date   HGBA1C 4.7 (L) 10/21/2022   MPG 88.19 10/21/2022   No results found for: "PROLACTIN" Lab Results  Component Value Date   CHOL 119 10/21/2022   TRIG 76 10/21/2022   HDL 40 (L) 10/21/2022   CHOLHDL 3.0 10/21/2022   VLDL 15 10/21/2022   LDLCALC 64 10/21/2022    See Psychiatric Specialty Exam and Suicide Risk Assessment completed by Attending Physician prior to discharge.  Discharge destination:  Home  Is patient on multiple antipsychotic therapies at discharge:  No   Has Patient had three or more failed trials of antipsychotic monotherapy by history:  No  Recommended Plan for Multiple  Antipsychotic Therapies: NA  Discharge Instructions     Increase activity slowly   Complete by: As directed       Allergies as of 10/25/2022       Reactions   Hydrocodone Hives, Nausea Only   Almond (diagnostic)    Codeine    Latex Rash        Medication List     TAKE these medications      Indication  ARIPiprazole 2 MG tablet Commonly known as: ABILIFY Take 1 tablet (2 mg total) by mouth daily. Start taking on: Oct 26, 2022  Indication: Major Depressive Disorder   escitalopram 20 MG tablet Commonly known as: LEXAPRO Take 1 tablet (20 mg total) by mouth daily. Start taking on: Oct 26, 2022  Indication: Major Depressive Disorder   hydrOXYzine 25 MG tablet Commonly known as: ATARAX Take 1 tablet (25 mg total) by mouth 3 (three) times daily as needed for anxiety.  Indication: Feeling Anxious        Follow-up Information     [facility]  [place]. Go on 11/01/2022.   Why: 2p with [facility].  Please arrive 15 minutes early. At the end of your appointment your therapist will make an appt for psychiatry Contact information: [address]  [place], [place] [address] [phone]               Follow-up recommendations:   Discharge Recommendations:  The patient is being discharged with her family. Patient is to take her discharge medications as ordered.  See follow up above. We recommend that she participates in individual therapy to target uncontrollable agitation and substance abuse.  We recommend that she participates in family therapy to target the conflict with her family, to improve communication skills and conflict resolution skills.  Family is to initiate/implement a contingency based behavioral model to address patient's behavior. We recommend that she get AIMS scale, height, weight, blood pressure, fasting lipid panel, fasting blood sugar in three months from discharge if she's on atypical antipsychotics.  Patient will benefit from monitoring of recurrent suicidal ideation since patient is on antidepressant medication. The patient should abstain from all illicit substances and alcohol. If the patient's symptoms worsen or do not continue to improve or if the patient becomes actively suicidal or homicidal then it is recommended that the patient return to the closest hospital emergency room or call 911 for further evaluation and treatment. National Suicide Prevention Lifeline 1800-SUICIDE or [phone]) [phone]. Please follow up with your primary medical doctor for all other medical needs.  The patient has been educated on the possible side effects to medications and she/her guardian is to contact a medical professional and inform outpatient provider of any new side effects of medication. She is to take regular diet and activity as tolerated.  Will benefit from moderate daily exercise. Family was educated about  removing/locking any firearms, medications or dangerous products from the home.  Activity:  As tolerated Diet:  Regular Diet  Signed: Cecilie Lowers, FNP 10/25/2022, 9:54 AM

## 2022-10-25 NOTE — Group Note (Signed)
Date:  10/25/2022 Time:  10:45 AM  Group Topic/Focus:  Goals Group:   The focus of this group is to help patients establish daily goals to achieve during treatment and discuss how the patient can incorporate goal setting into their daily lives to aide in recovery. Orientation:   The focus of this group is to educate the patient on the purpose and policies of crisis stabilization and provide a format to answer questions about their admission.  The group details unit policies and expectations of patients while admitted.    Participation Level:  Active  Participation Quality:  Appropriate  Affect:  Appropriate  Cognitive:  Appropriate  Insight: Appropriate  Engagement in Group:  Engaged  Modes of Intervention:  Discussion, Orientation, and Rapport Building  Additional Comments:   Pt attended and participated in the Orientation/Goals group. Pt personal goal is to "spend time with my kids today".  Emma Cruz 10/25/2022, 10:45 AM
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
